# Patient Record
Sex: Female | Born: 1985 | Race: Black or African American | Hispanic: No | Marital: Single | Smoking: Never smoker
Health system: Southern US, Community
[De-identification: ages and names within clinical notes are randomized; demographics above are authoritative.]

## PROBLEM LIST (undated history)

## (undated) DIAGNOSIS — D649 Anemia, unspecified: Secondary | ICD-10-CM

## (undated) DIAGNOSIS — I10 Essential (primary) hypertension: Secondary | ICD-10-CM

---

## 2011-10-19 ENCOUNTER — Other Ambulatory Visit: Payer: Self-pay

## 2011-11-10 ENCOUNTER — Other Ambulatory Visit (HOSPITAL_COMMUNITY): Payer: Self-pay | Admitting: Obstetrics and Gynecology

## 2011-11-10 DIAGNOSIS — R772 Abnormality of alphafetoprotein: Secondary | ICD-10-CM

## 2011-11-13 ENCOUNTER — Ambulatory Visit (HOSPITAL_COMMUNITY): Payer: Medicaid Other

## 2011-11-17 ENCOUNTER — Ambulatory Visit (HOSPITAL_COMMUNITY): Payer: Medicaid Other

## 2011-11-17 ENCOUNTER — Ambulatory Visit (HOSPITAL_COMMUNITY): Admission: RE | Admit: 2011-11-17 | Payer: Medicaid Other | Source: Ambulatory Visit

## 2011-12-16 ENCOUNTER — Other Ambulatory Visit (HOSPITAL_COMMUNITY): Payer: Self-pay | Admitting: Obstetrics and Gynecology

## 2011-12-16 DIAGNOSIS — Z3689 Encounter for other specified antenatal screening: Secondary | ICD-10-CM

## 2011-12-16 DIAGNOSIS — R772 Abnormality of alphafetoprotein: Secondary | ICD-10-CM

## 2011-12-18 ENCOUNTER — Encounter (HOSPITAL_COMMUNITY): Payer: Self-pay | Admitting: Obstetrics and Gynecology

## 2011-12-29 ENCOUNTER — Ambulatory Visit (HOSPITAL_COMMUNITY)
Admission: RE | Admit: 2011-12-29 | Discharge: 2011-12-29 | Disposition: A | Discharge status: Home / Self Care | Payer: Medicaid Other | Source: Ambulatory Visit | Attending: Obstetrics and Gynecology | Admitting: Obstetrics and Gynecology

## 2011-12-29 ENCOUNTER — Ambulatory Visit (HOSPITAL_COMMUNITY): Payer: Medicaid Other

## 2011-12-29 DIAGNOSIS — R772 Abnormality of alphafetoprotein: Secondary | ICD-10-CM

## 2011-12-29 DIAGNOSIS — Z3689 Encounter for other specified antenatal screening: Secondary | ICD-10-CM

## 2013-06-26 ENCOUNTER — Emergency Department (HOSPITAL_BASED_OUTPATIENT_CLINIC_OR_DEPARTMENT_OTHER)
Admission: EM | Admit: 2013-06-26 | Discharge: 2013-06-26 | Disposition: A | Discharge status: Home / Self Care | Payer: Medicaid Other | Attending: Emergency Medicine | Admitting: Emergency Medicine

## 2013-06-26 ENCOUNTER — Encounter (HOSPITAL_BASED_OUTPATIENT_CLINIC_OR_DEPARTMENT_OTHER): Payer: Self-pay | Admitting: Emergency Medicine

## 2013-06-26 DIAGNOSIS — S90569A Insect bite (nonvenomous), unspecified ankle, initial encounter: Secondary | ICD-10-CM | POA: Insufficient documentation

## 2013-06-26 DIAGNOSIS — Y939 Activity, unspecified: Secondary | ICD-10-CM | POA: Insufficient documentation

## 2013-06-26 DIAGNOSIS — Y929 Unspecified place or not applicable: Secondary | ICD-10-CM | POA: Insufficient documentation

## 2013-06-26 DIAGNOSIS — W57XXXA Bitten or stung by nonvenomous insect and other nonvenomous arthropods, initial encounter: Secondary | ICD-10-CM

## 2013-06-26 MED ORDER — HYDROCORTISONE 1 % EX CREA: TOPICAL_CREAM | CUTANEOUS | Status: DC

## 2013-06-26 NOTE — ED Provider Notes (Signed)
CSN: [id number]     Arrival date & time 06/26/13  1941 History   First MD Initiated Contact with Patient 06/26/13 2110     Chief Complaint  Patient presents with  . Insect Bite     (Consider location/radiation/quality/duration/timing/severity/associated sxs/prior Treatment) HPI Comments: Presents emergency department with chief complaint of insect bite. She states that she noticed by this morning. She states it is moderately itchy, but not painful. She denies fevers, chills, or vomiting. She states that she is tried using antibiotic ointment on the bite. As also been taking amoxicillin for sore throat. She denies any aggravating or alleviating factors.  The history is provided by the patient. No language interpreter was used.    History reviewed. No pertinent past medical history. Past Surgical History  Procedure Laterality Date  . Cesarean section     No family history on file. History  Substance Use Topics  . Smoking status: Never Smoker   . Smokeless tobacco: Not on file  . Alcohol Use: No   OB History   Grav Para Term Preterm Abortions TAB SAB Ect Mult Living                 Review of Systems  Constitutional: Negative for fever and chills.  Respiratory: Negative for shortness of breath.   Cardiovascular: Negative for chest pain.  Gastrointestinal: Negative for nausea, vomiting, diarrhea and constipation.  Genitourinary: Negative for dysuria.  Skin: Positive for rash.       Small insect bite on the left upper thigh      Allergies  Review of patient's allergies indicates no known allergies.  Home Medications   Current Outpatient Rx  Name  Route  Sig  Dispense  Refill  . hydrocortisone cream 1 %      Apply to affected area 2 times daily   15 g   0    BP 126/96  Pulse 85  Temp(Src) 99.2 F (37.3 C) (Oral)  Resp 20  Ht 5\' 3"  (1.6 m)  Wt 144 lb (65.318 kg)  BMI 25.51 kg/m2  SpO2 100%  LMP 05/29/2013 Physical Exam  Nursing note and vitals  reviewed. Constitutional: She is oriented to person, place, and time. She appears well-developed and well-nourished.  HENT:  Head: Normocephalic and atraumatic.  Eyes: Conjunctivae and EOM are normal.  Neck: Normal range of motion.  Cardiovascular: Normal rate.   Pulmonary/Chest: Effort normal.  Abdominal: She exhibits no distension.  Musculoskeletal: Normal range of motion.  Neurological: She is alert and oriented to person, place, and time.  Skin: Skin is dry.  2 x 2 centimeter erythematous area to the left upper thigh, no abscess, non-indurated, no fluctuance, appears to be allergic, and not cellulitic  Psychiatric: She has a normal mood and affect. Her behavior is normal. Judgment and thought content normal.    ED Course  Procedures (including critical care time) Labs Review Labs Reviewed - No data to display Imaging Review No results found.   EKG Interpretation None      MDM   Final diagnoses:  Insect bite    Patient with insect bite. Will treat with hydrocortisone cream. Recommend discharge. Followup with PCP. Return precautions regarding cellulitis are given. Patient is stable and ready for discharge.   Roxy Horsemanobert Mayre Bury, PA-C 06/26/13 2152

## 2013-06-26 NOTE — Discharge Instructions (Signed)
Insect Bite  Mosquitoes, flies, fleas, bedbugs, and many other insects can bite. Insect bites are different from insect stings. A sting is when venom is injected into the skin. Some insect bites can transmit infectious diseases.  SYMPTOMS   Insect bites usually turn red, swell, and itch for 2 to 4 days. They often go away on their own.  TREATMENT   Your caregiver may prescribe antibiotic medicines if a bacterial infection develops in the bite.  HOME CARE INSTRUCTIONS   Do not scratch the bite area.   Keep the bite area clean and dry. Wash the bite area thoroughly with soap and water.   Put ice or cool compresses on the bite area.   Put ice in a plastic bag.   Place a towel between your skin and the bag.   Leave the ice on for 20 minutes, 4 times a day for the first 2 to 3 days, or as directed.   You may apply a baking soda paste, cortisone cream, or calamine lotion to the bite area as directed by your caregiver. This can help reduce itching and swelling.   Only take over-the-counter or prescription medicines as directed by your caregiver.   If you are given antibiotics, take them as directed. Finish them even if you start to feel better.  You may need a tetanus shot if:   You cannot remember when you had your last tetanus shot.   You have never had a tetanus shot.   The injury broke your skin.  If you get a tetanus shot, your arm may swell, get red, and feel warm to the touch. This is common and not a problem. If you need a tetanus shot and you choose not to have one, there is a rare chance of getting tetanus. Sickness from tetanus can be serious.  SEEK IMMEDIATE MEDICAL CARE IF:    You have increased pain, redness, or swelling in the bite area.   You see a red line on the skin coming from the bite.   You have a fever.   You have joint pain.   You have a headache or neck pain.   You have unusual weakness.   You have a rash.   You have chest pain or shortness of breath.    You have abdominal pain, nausea, or vomiting.   You feel unusually tired or sleepy.  MAKE SURE YOU:    Understand these instructions.   Will watch your condition.   Will get help right away if you are not doing well or get worse.  Document Released: 04/09/2004 Document Revised: 05/25/2011 Document Reviewed: 10/01/2010  ExitCare Patient Information 2014 [employer].

## 2013-06-26 NOTE — ED Provider Notes (Signed)
Medical screening examination/treatment/procedure(s) were performed by non-physician practitioner and as supervising physician I was immediately available for consultation/collaboration.   EKG Interpretation None        Kaoir Loree, MD 06/26/13 2254 

## 2013-06-26 NOTE — ED Notes (Signed)
Possible insect bite to her left upper leg. Itching and red since this am.

## 2014-07-28 ENCOUNTER — Emergency Department (HOSPITAL_BASED_OUTPATIENT_CLINIC_OR_DEPARTMENT_OTHER)
Admission: EM | Admit: 2014-07-28 | Discharge: 2014-07-28 | Disposition: A | Discharge status: Home / Self Care | Payer: Medicaid Other | Attending: Emergency Medicine | Admitting: Emergency Medicine

## 2014-07-28 ENCOUNTER — Encounter (HOSPITAL_BASED_OUTPATIENT_CLINIC_OR_DEPARTMENT_OTHER): Payer: Self-pay

## 2014-07-28 DIAGNOSIS — R030 Elevated blood-pressure reading, without diagnosis of hypertension: Secondary | ICD-10-CM | POA: Diagnosis not present

## 2014-07-28 DIAGNOSIS — K0381 Cracked tooth: Secondary | ICD-10-CM | POA: Diagnosis not present

## 2014-07-28 DIAGNOSIS — K088 Other specified disorders of teeth and supporting structures: Secondary | ICD-10-CM | POA: Insufficient documentation

## 2014-07-28 DIAGNOSIS — K0889 Other specified disorders of teeth and supporting structures: Secondary | ICD-10-CM

## 2014-07-28 MED ORDER — IBUPROFEN 800 MG PO TABS: 800.0000 mg | ORAL_TABLET | Freq: Three times a day (TID) | ORAL | Status: DC

## 2014-07-28 MED ORDER — BUPIVACAINE-EPINEPHRINE (PF) 0.5% -1:200000 IJ SOLN
1.8000 mL | Freq: Once | INTRAMUSCULAR | Status: AC
  Administered 2014-07-28: 1.8 mL
  Filled 2014-07-28: qty 1.8

## 2014-07-28 MED ORDER — PENICILLIN V POTASSIUM 500 MG PO TABS: 500.0000 mg | ORAL_TABLET | Freq: Four times a day (QID) | ORAL | Status: AC

## 2014-07-28 NOTE — Discharge Instructions (Signed)
It is important for you to go to your dentist for regularly scheduled appointment and definitive dental care. Please take your antibiotics as directed until gone. Take your Motrin as needed for discomfort. Return to ED for new or worsening symptoms.  Dental Pain A tooth ache may be caused by cavities (tooth decay). Cavities expose the nerve of the tooth to air and hot or cold temperatures. It may come from an infection or abscess (also called a boil or furuncle) around your tooth. It is also often caused by dental caries (tooth decay). This causes the pain you are having. DIAGNOSIS  Your caregiver can diagnose this problem by exam. TREATMENT   If caused by an infection, it may be treated with medications which kill germs (antibiotics) and pain medications as prescribed by your caregiver. Take medications as directed.  Only take over-the-counter or prescription medicines for pain, discomfort, or fever as directed by your caregiver.  Whether the tooth ache today is caused by infection or dental disease, you should see your dentist as soon as possible for further care. SEEK MEDICAL CARE IF: The exam and treatment you received today has been provided on an emergency basis only. This is not a substitute for complete medical or dental care. If your problem worsens or new problems (symptoms) appear, and you are unable to meet with your dentist, call or return to this location. SEEK IMMEDIATE MEDICAL CARE IF:   You have a fever.  You develop redness and swelling of your face, jaw, or neck.  You are unable to open your mouth.  You have severe pain uncontrolled by pain medicine. MAKE SURE YOU:   Understand these instructions.  Will watch your condition.  Will get help right away if you are not doing well or get worse. Document Released: 03/02/2005 Document Revised: 05/25/2011 Document Reviewed: 10/19/2007 [facility]Exit[facility] Patient Information 2015 [place]ExitCare, [place]. This information is not intended to  replace advice given to you by your health care provider. Make sure you discuss any questions you have with your health care provider.  Dental Care and Dentist Visits Dental care supports good overall health. Regular dental visits can also help you avoid dental pain, bleeding, infection, and other more serious health problems in the future. It is important to keep the mouth healthy because diseases in the teeth, gums, and other oral tissues can spread to other areas of the body. Some problems, such as diabetes, heart disease, and pre-term labor have been associated with poor oral health.  See your dentist every 6 months. If you experience emergency problems such as a toothache or broken tooth, go to the dentist right away. If you see your dentist regularly, you may catch problems early. It is easier to be treated for problems in the early stages.  WHAT TO EXPECT AT A DENTIST VISIT  Your dentist will look for many common oral health problems and recommend proper treatment. At your regular dental visit, you can expect:  Gentle cleaning of the teeth and gums. This includes scraping and polishing. This helps to remove the sticky substance around the teeth and gums (plaque). Plaque forms in the mouth shortly after eating. Over time, plaque hardens on the teeth as tartar. If tartar is not removed regularly, it can cause problems. Cleaning also helps remove stains.  Periodic X-rays. These pictures of the teeth and supporting bone will help your dentist assess the health of your teeth.  Periodic fluoride treatments. Fluoride is a natural mineral shown to help strengthen teeth. Fluoride  treatmentinvolves applying a fluoride gel or varnish to the teeth. It is most commonly done in children.  Examination of the mouth, tongue, jaws, teeth, and gums to look for any oral health problems, such as:  Cavities (dental caries). This is decay on the tooth caused by plaque, sugar, and acid in the mouth. It is best to  catch a cavity when it is small.  Inflammation of the gums caused by plaque buildup (gingivitis).  Problems with the mouth or malformed or misaligned teeth.  Oral cancer or other diseases of the soft tissues or jaws. KEEP YOUR TEETH AND GUMS HEALTHY For healthy teeth and gums, follow these general guidelines as well as your dentist's specific advice:  Have your teeth professionally cleaned at the dentist every 6 months.  Brush twice daily with a fluoride toothpaste.  Floss your teeth daily.  Ask your dentist if you need fluoride supplements, treatments, or fluoride toothpaste.  Eat a healthy diet. Reduce foods and drinks with added sugar.  Avoid smoking. TREATMENT FOR ORAL HEALTH PROBLEMS If you have oral health problems, treatment varies depending on the conditions present in your teeth and gums.  Your caregiver will most likely recommend good oral hygiene at each visit.  For cavities, gingivitis, or other oral health disease, your caregiver will perform a procedure to treat the problem. This is typically done at a separate appointment. Sometimes your caregiver will refer you to another dental specialist for specific tooth problems or for surgery. SEEK IMMEDIATE DENTAL CARE IF:  You have pain, bleeding, or soreness in the gum, tooth, jaw, or mouth area.  A permanent tooth becomes loose or separated from the gum socket.  You experience a blow or injury to the mouth or jaw area. Document Released: 11/12/2010 Document Revised: 05/25/2011 Document Reviewed: 11/12/2010 [facility]ExitCare Patient Information 2015 ReaganExitCare, MarylandLLC. This information is not intended to replace advice given to you by your health care provider. Make sure you discuss any questions you have with your health care provider.

## 2014-07-28 NOTE — ED Notes (Signed)
Patient here with right lower toothache for the past few days, broken for a few months

## 2014-07-28 NOTE — ED Provider Notes (Signed)
CSN: [id number]     Arrival date & time 07/28/14  1721 History   First MD Initiated Contact with Patient 07/28/14 1739     Chief Complaint  Patient presents with  . Dental Pain     (Consider location/radiation/quality/duration/timing/severity/associated sxs/prior Treatment) HPI Anna Brock is a 29 y.o. female who comes in for evaluation of right lower toothache. Patient states originally her tooth pain began a few months ago when she bit into some food that had some retained plastic utensil and it cracked her tooth. She reports having a dentist appointment on Monday for follow-up. Came to ED for pain relief. Denies fevers, chills, difficulty swallowing, breathing, changes in phonation. No other modifying factors.  History reviewed. No pertinent past medical history. Past Surgical History  Procedure Laterality Date  . Cesarean section     No family history on file. History  Substance Use Topics  . Smoking status: Never Smoker   . Smokeless tobacco: Not on file  . Alcohol Use: No   OB History    No data available     Review of Systems A 10 point review of systems was completed and was negative except for pertinent positives and negatives as mentioned in the history of present illness     Allergies  Review of patient's allergies indicates no known allergies.  Home Medications   Prior to Admission medications   Medication Sig Start Date End Date Taking? Authorizing Provider  ibuprofen (ADVIL,MOTRIN) 800 MG tablet Take 1 tablet (800 mg total) by mouth 3 (three) times daily. 07/28/14   Joycie PeekBenjamin Symeon Puleo, PA-C  penicillin v potassium (VEETID) 500 MG tablet Take 1 tablet (500 mg total) by mouth 4 (four) times daily. 07/28/14 08/04/14  Joycie PeekBenjamin Julliana Whitmyer, PA-C   BP 155/119 mmHg  Pulse 85  Temp(Src) 99.3 F (37.4 C)  Resp 18  Ht 5\' 3"  (1.6 m)  Wt 160 lb (72.576 kg)  BMI 28.35 kg/m2  SpO2 100% Physical Exam  Constitutional:  Awake, alert, nontoxic appearance.  HENT:  Head:  Atraumatic.  Discomfort located to right posterior lower molar. Chronic, complete axial tooth fracture. Otherwise appropriate dentition. Mucous membranes are moist. No unilateral tonsillar swelling, uvula midline, no glossal swelling or elevation. No trismus. No fluctuance or evidence of a drainable abscess. No other evidence of emergent infection, Retropharyngeal or Peritonsillar abscess, Ludwig or Vincents angina. Tolerating secretions well. Patent airway   Eyes: Right eye exhibits no discharge. Left eye exhibits no discharge.  Neck: Neck supple.  Pulmonary/Chest: Effort normal. She exhibits no tenderness.  Abdominal: Soft. There is no tenderness. There is no rebound.  Musculoskeletal: She exhibits no tenderness.  Baseline ROM, no obvious new focal weakness.  Neurological:  Mental status and motor strength appears baseline for patient and situation.  Skin: No rash noted.  Psychiatric: She has a normal mood and affect.  Nursing note and vitals reviewed.   ED Course  Procedures (including critical care time) NERVE BLOCK Performed by: Sharlene Mottsartner, Markail Diekman W Consent: Verbal consent obtained. Required items: required blood products, implants, devices, and special equipment available Time out: Immediately prior to procedure a "time out" was called to verify the correct patient, procedure, equipment, support staff and site/side marked as required.  Indication: Dental pain  Nerve block body site: Right inferior alveolar   Preparation: Patient was prepped and draped in the usual sterile fashion. Needle gauge: 24 G Location technique: anatomical landmarks  Local anesthetic: Bupivacaine   Anesthetic total: 1.8 ml  Outcome: pain improved Patient tolerance: Patient  tolerated the procedure well with no immediate complications.  Labs Review Labs Reviewed - No data to display  Imaging Review No results found.   EKG Interpretation None     Meds given in ED:  Medications   bupivacaine-epinephrine (MARCAINE W/ EPI) 0.5% -1:200000 injection 1.8 mL (1.8 mLs Infiltration Given by Other 07/28/14 1820)    Discharge Medication List as of 07/28/2014  6:41 PM    START taking these medications   Details  penicillin v potassium (VEETID) 500 MG tablet Take 1 tablet (500 mg total) by mouth 4 (four) times daily., Starting 07/28/2014, Until Sat 08/04/14, Print        MDM  Vitals stable -afebrile.. Elevated blood pressure likely secondary to pain, improving in ED. Pt resting comfortably in ED.  reports resolution of dental pain following oral nerve block. PE--physical exam not concerning for acute or emergent pathology. Pain airway tolerate secretions well without trismus also elevation or changes in phonation. Stress importance for patient to go to her dentist for her regularly scheduled appointment on Monday. Will DC with antibiotics, NSAIDs for discomfort. I discussed all relevant lab findings and imaging results with pt and they verbalized understanding. Discussed f/u with PCP within 48 hrs and return precautions, pt very amenable to plan.  Final diagnoses:  Pain, dental      Joycie PeekBenjamin Catlyn Shipton, PA-C 07/29/14 [address]1159  Florina Glas, PA-C 07/29/14 1221  Richardean Canalavid H Yao, MD 07/29/14 [phone]

## 2014-10-04 ENCOUNTER — Emergency Department (HOSPITAL_BASED_OUTPATIENT_CLINIC_OR_DEPARTMENT_OTHER): Payer: Medicaid Other

## 2014-10-04 ENCOUNTER — Emergency Department (HOSPITAL_BASED_OUTPATIENT_CLINIC_OR_DEPARTMENT_OTHER)
Admission: EM | Admit: 2014-10-04 | Discharge: 2014-10-04 | Disposition: A | Discharge status: Home / Self Care | Payer: Medicaid Other | Attending: Emergency Medicine | Admitting: Emergency Medicine

## 2014-10-04 ENCOUNTER — Encounter (HOSPITAL_BASED_OUTPATIENT_CLINIC_OR_DEPARTMENT_OTHER): Payer: Self-pay | Admitting: Emergency Medicine

## 2014-10-04 DIAGNOSIS — R0789 Other chest pain: Secondary | ICD-10-CM | POA: Diagnosis not present

## 2014-10-04 DIAGNOSIS — R42 Dizziness and giddiness: Secondary | ICD-10-CM | POA: Insufficient documentation

## 2014-10-04 DIAGNOSIS — R0981 Nasal congestion: Secondary | ICD-10-CM | POA: Diagnosis not present

## 2014-10-04 DIAGNOSIS — R0602 Shortness of breath: Secondary | ICD-10-CM | POA: Diagnosis not present

## 2014-10-04 DIAGNOSIS — R202 Paresthesia of skin: Secondary | ICD-10-CM | POA: Insufficient documentation

## 2014-10-04 DIAGNOSIS — Z7951 Long term (current) use of inhaled steroids: Secondary | ICD-10-CM | POA: Insufficient documentation

## 2014-10-04 DIAGNOSIS — R200 Anesthesia of skin: Secondary | ICD-10-CM | POA: Insufficient documentation

## 2014-10-04 DIAGNOSIS — E876 Hypokalemia: Secondary | ICD-10-CM | POA: Diagnosis not present

## 2014-10-04 DIAGNOSIS — Z862 Personal history of diseases of the blood and blood-forming organs and certain disorders involving the immune mechanism: Secondary | ICD-10-CM | POA: Insufficient documentation

## 2014-10-04 DIAGNOSIS — Z791 Long term (current) use of non-steroidal anti-inflammatories (NSAID): Secondary | ICD-10-CM | POA: Diagnosis not present

## 2014-10-04 DIAGNOSIS — R079 Chest pain, unspecified: Secondary | ICD-10-CM | POA: Diagnosis present

## 2014-10-04 HISTORY — DX: Anemia, unspecified: D64.9

## 2014-10-04 LAB — CBC WITH DIFFERENTIAL/PLATELET
Basophils Absolute: 0 10*3/uL (ref 0.0–0.1)
Basophils Relative: 1 % (ref 0–1)
Eosinophils Absolute: 0.2 10*3/uL (ref 0.0–0.7)
Eosinophils Relative: 5 % (ref 0–5)
HCT: 33.8 % — ABNORMAL LOW (ref 36.0–46.0)
Hemoglobin: 10.8 g/dL — ABNORMAL LOW (ref 12.0–15.0)
LYMPHS PCT: 56 % — AB (ref 12–46)
Lymphs Abs: 2.3 10*3/uL (ref 0.7–4.0)
MCH: 26.9 pg (ref 26.0–34.0)
MCHC: 32 g/dL (ref 30.0–36.0)
MCV: 84.1 fL (ref 78.0–100.0)
MONOS PCT: 6 % (ref 3–12)
Monocytes Absolute: 0.2 10*3/uL (ref 0.1–1.0)
NEUTROS PCT: 32 % — AB (ref 43–77)
Neutro Abs: 1.3 10*3/uL — ABNORMAL LOW (ref 1.7–7.7)
PLATELETS: 299 10*3/uL (ref 150–400)
RBC: 4.02 MIL/uL (ref 3.87–5.11)
RDW: 14.5 % (ref 11.5–15.5)
WBC: 4.1 10*3/uL (ref 4.0–10.5)

## 2014-10-04 LAB — COMPREHENSIVE METABOLIC PANEL
ALT: 14 U/L (ref 14–54)
AST: 19 U/L (ref 15–41)
Albumin: 3.8 g/dL (ref 3.5–5.0)
Alkaline Phosphatase: 57 U/L (ref 38–126)
Anion gap: 4 — ABNORMAL LOW (ref 5–15)
BUN: 6 mg/dL (ref 6–20)
CALCIUM: 8.9 mg/dL (ref 8.9–10.3)
CHLORIDE: 109 mmol/L (ref 101–111)
CO2: 25 mmol/L (ref 22–32)
CREATININE: 0.83 mg/dL (ref 0.44–1.00)
GFR calc Af Amer: >60 mL/min (ref >60)
GFR calc non Af Amer: >60 mL/min (ref >60)
Glucose, Bld: 96 mg/dL (ref 65–99)
Potassium: 3.1 mmol/L — ABNORMAL LOW (ref 3.5–5.1)
Sodium: 138 mmol/L (ref 135–145)
TOTAL PROTEIN: 7 g/dL (ref 6.5–8.1)
Total Bilirubin: 0.3 mg/dL (ref 0.3–1.2)

## 2014-10-04 LAB — D-DIMER, QUANTITATIVE: D-Dimer, Quant: <0.27 ug/mL-FEU (ref 0.00–0.48)

## 2014-10-04 LAB — TROPONIN I: Troponin I: <0.03 ng/mL (ref <0.031)

## 2014-10-04 MED ORDER — POTASSIUM CHLORIDE CRYS ER 20 MEQ PO TBCR
40.0000 meq | EXTENDED_RELEASE_TABLET | Freq: Once | ORAL | Status: AC
  Administered 2014-10-04: 40 meq via ORAL
  Filled 2014-10-04: qty 2

## 2014-10-04 NOTE — Discharge Instructions (Signed)
Chest Pain (Nonspecific) °There is no evidence of heart attack or blood clot in the lung. Follow up with your doctor. Return to the ED if you develop new or worsening symptoms. °It is often hard to give a specific diagnosis for the cause of chest pain. There is always a chance that your pain could be related to something serious, such as a heart attack or a blood clot in the lungs. You need to follow up with your health care provider for further evaluation. °CAUSES  °· Heartburn. °· Pneumonia or bronchitis. °· Anxiety or stress. °· Inflammation around your heart (pericarditis) or lung (pleuritis or pleurisy). °· A blood clot in the lung. °· A collapsed lung (pneumothorax). It can develop suddenly on its own (spontaneous pneumothorax) or from trauma to the chest. °· Shingles infection (herpes zoster virus). °The chest wall is composed of bones, muscles, and cartilage. Any of these can be the source of the pain. °· The bones can be bruised by injury. °· The muscles or cartilage can be strained by coughing or overwork. °· The cartilage can be affected by inflammation and become sore (costochondritis). °DIAGNOSIS  °Lab tests or other studies may be needed to find the cause of your pain. Your health care provider may have you take a test called an ambulatory electrocardiogram (ECG). An ECG records your heartbeat patterns over a 24-hour period. You may also have other tests, such as: °· Transthoracic echocardiogram (TTE). During echocardiography, sound waves are used to evaluate how blood flows through your heart. °· Transesophageal echocardiogram (TEE). °· Cardiac monitoring. This allows your health care provider to monitor your heart rate and rhythm in real time. °· Holter monitor. This is a portable device that records your heartbeat and can help diagnose heart arrhythmias. It allows your health care provider to track your heart activity for several days, if needed. °· Stress tests by exercise or by giving medicine  that makes the heart beat faster. °TREATMENT  °· Treatment depends on what may be causing your chest pain. Treatment may include: °¨ Acid blockers for heartburn. °¨ Anti-inflammatory medicine. °¨ Pain medicine for inflammatory conditions. °¨ Antibiotics if an infection is present. °· You may be advised to change lifestyle habits. This includes stopping smoking and avoiding alcohol, caffeine, and chocolate. °· You may be advised to keep your head raised (elevated) when sleeping. This reduces the chance of acid going backward from your stomach into your esophagus. °Most of the time, nonspecific chest pain will improve within 2-3 days with rest and mild pain medicine.  °HOME CARE INSTRUCTIONS  °· If antibiotics were prescribed, take them as directed. Finish them even if you start to feel better. °· For the next few days, avoid physical activities that bring on chest pain. Continue physical activities as directed. °· Do not use any tobacco products, including cigarettes, chewing tobacco, or electronic cigarettes. °· Avoid drinking alcohol. °· Only take medicine as directed by your health care provider. °· Follow your health care provider's suggestions for further testing if your chest pain does not go away. °· Keep any follow-up appointments you made. If you do not go to an appointment, you could develop lasting (chronic) problems with pain. If there is any problem keeping an appointment, call to reschedule. °SEEK MEDICAL CARE IF:  °· Your chest pain does not go away, even after treatment. °· You have a rash with blisters on your chest. °· You have a fever. °SEEK IMMEDIATE MEDICAL CARE IF:  °· You have increased   chest pain or pain that spreads to your arm, neck, jaw, back, or abdomen. °· You have shortness of breath. °· You have an increasing cough, or you cough up blood. °· You have severe back or abdominal pain. °· You feel nauseous or vomit. °· You have severe weakness. °· You faint. °· You have chills. °This is an  emergency. Do not wait to see if the pain will go away. Get medical help at once. Call your local emergency services (911 in [place] Do not drive yourself to the hospital. °MAKE SURE YOU:  °· Understand these instructions. °· Will watch your condition. °· Will get help right away if you are not doing well or get worse. °Document Released: 12/10/2004 Document Revised: 03/07/2013 Document Reviewed: 10/06/2007 °ExitCare® Patient Information ©2015 [employer]. This information is not intended to replace advice given to you by your health care provider. Make sure you discuss any questions you have with your health care provider. ° °

## 2014-10-04 NOTE — ED Notes (Signed)
Nurse first-pt assisted from car to w/c to ED lobby-pt stood w/o difficulty-c/o difficulty breathing-pt in no resp distress

## 2014-10-04 NOTE — ED Notes (Signed)
Upper left sided CP that started 2 hours ago.  Radiation to left arm.  SOB and lightheaded.

## 2014-10-04 NOTE — ED Provider Notes (Signed)
CSN: [id number]     Arrival date & time 10/04/14  1911 History  This chart was scribed for Glynn Octave, MD by Budd Palmer, ED Scribe. This patient was seen in room MH03/MH03 and the patient's care was started at 7:33 PM.    Chief Complaint  Patient presents with  . Chest Pain   The history is provided by the patient. No language interpreter was used.   HPI Comments: Anna Brock is a 29 y.o. female with a PMHx of anemia who presents to the Emergency Department complaining of intermittent, upper left-sided, pressure-like CP onset 2 hours ago. Pt states the pain lasts for about 5 minutes each time, and occurs every few minutes. She notes associated bilateral leg and left arm numbness and tingling. She also reports congestion in the nose and ears, lightheadedness, as well as SOB. She has started her most recent period 2 days ago and reports heavy bleeding and discharge of clots for the past 2 nights. She is currently on birth control. Pt denies sore throat, back pain, vomiting, HA, and visual disturbances.  Past Medical History  Diagnosis Date  . Anemia    Past Surgical History  Procedure Laterality Date  . Cesarean section     No family history on file. History  Substance Use Topics  . Smoking status: Never Smoker   . Smokeless tobacco: Not on file  . Alcohol Use: No   OB History    No data available     Review of Systems A complete 10 system review of systems was obtained and all systems are negative except as noted in the HPI and PMH.   Allergies  Review of patient's allergies indicates no known allergies.  Home Medications   Prior to Admission medications   Medication Sig Start Date End Date Taking? Authorizing Provider  fluticasone (FLONASE) 50 MCG/ACT nasal spray Place 1 spray into both nostrils daily.   Yes Historical Provider, MD  ibuprofen (ADVIL,MOTRIN) 800 MG tablet Take 1 tablet (800 mg total) by mouth 3 (three) times daily. 07/28/14   Benjamin Cartner,  PA-C   BP 131/97 mmHg  Pulse 75  Temp(Src) 98.4 F (36.9 C) (Oral)  Resp 18  Ht 5\' 3"  (1.6 m)  Wt 165 lb (74.844 kg)  BMI 29.24 kg/m2  SpO2 100%  LMP 09/20/2014 (Approximate) Physical Exam  Constitutional: She is oriented to person, place, and time. She appears well-developed and well-nourished. No distress.  HENT:  Head: Normocephalic and atraumatic.  Mouth/Throat: Oropharynx is clear and moist. No oropharyngeal exudate.  Eyes: Conjunctivae and EOM are normal. Pupils are equal, round, and reactive to light.  Neck: Normal range of motion. Neck supple.  No meningismus.  Cardiovascular: Normal rate, regular rhythm, normal heart sounds and intact distal pulses.   No murmur heard. Pulmonary/Chest: Effort normal and breath sounds normal. No respiratory distress.  Left chest wall is TTP  Abdominal: Soft. There is no tenderness. There is no rebound and no guarding.  Musculoskeletal: Normal range of motion. She exhibits no edema or tenderness.  Neurological: She is alert and oriented to person, place, and time. No cranial nerve deficit. She exhibits normal muscle tone. Coordination normal.  No ataxia on finger to nose bilaterally. No pronator drift. 5/5 strength throughout. CN 2-12 intact. Negative Romberg. Equal grip strength. Sensation intact. Gait is normal. Equal Strength and sensation to arms and legs.  Skin: Skin is warm.  Psychiatric: She has a normal mood and affect. Her behavior is normal.  Nursing note  and vitals reviewed.   ED Course  Procedures  DIAGNOSTIC STUDIES: Oxygen Saturation is 100% on RA, normal by my interpretation.    COORDINATION OF CARE: 7:38 PM - Discussed plans to order chest XR and other diagnostic studies. Pt advised of plan for treatment and pt agrees.  Labs Review Labs Reviewed  CBC WITH DIFFERENTIAL/PLATELET - Abnormal; Notable for the following:    Hemoglobin 10.8 (*)    HCT 33.8 (*)    Neutrophils Relative % 32 (*)    Neutro Abs 1.3 (*)     Lymphocytes Relative 56 (*)    All other components within normal limits  COMPREHENSIVE METABOLIC PANEL - Abnormal; Notable for the following:    Potassium 3.1 (*)    Anion gap 4 (*)    All other components within normal limits  TROPONIN I  D-DIMER, QUANTITATIVE (NOT AT [facility])    Imaging Review Dg Chest 2 View  10/04/2014   CLINICAL DATA:  Intermittent upper left chest pressure-like chest pain for the past 2 hours. Shortness of breath.  EXAM: CHEST  2 VIEW  COMPARISON:  None.  FINDINGS: The heart size and mediastinal contours are within normal limits. Both lungs are clear. The visualized skeletal structures are unremarkable.  IMPRESSION: Normal examination.   Electronically Signed   By: Beckie Salts M.D.   On: 10/04/2014 20:11     EKG Interpretation   Date/Time:  Thursday October 04 2014 19:37:49 EDT Ventricular Rate:  77 PR Interval:  144 QRS Duration: 90 QT Interval:  376 QTC Calculation: 425 R Axis:   88 Text Interpretation:  Normal sinus rhythm Normal ECG No previous ECGs  available Confirmed by Manus Gunning  MD, Joyell Emami [phone]) on 10/04/2014 8:00:41  PM      MDM   Final diagnoses:  Atypical chest pain  Hypokalemia   Left-sided chest pain that onset 2 hours ago, coming and going associated with shortness of breath and lightheadedness. No fever, cough, difficulty swallowing. Patient is on birth control. EKG normal sinus rhythm.  Left upper chest wall is tender to palpation.  Hemoglobin 10.8. No comparison. Chest x-rays negative. D-dimer is negative.  Low suspicion for ACS or PE. Pain has resolved in the ED. No shortness of breath, nausea or lightheadedness.  Treat conservatively for chest wall pain. Follow-up with PCP. Return precautions discussed.   I personally performed the services described in this documentation, which was scribed in my presence. The recorded information has been reviewed and is accurate.   Glynn Octave, MD 10/04/14 [phone]

## 2015-02-26 ENCOUNTER — Emergency Department (HOSPITAL_BASED_OUTPATIENT_CLINIC_OR_DEPARTMENT_OTHER): Payer: Medicaid Other

## 2015-02-26 ENCOUNTER — Encounter (HOSPITAL_BASED_OUTPATIENT_CLINIC_OR_DEPARTMENT_OTHER): Payer: Self-pay

## 2015-02-26 ENCOUNTER — Emergency Department (HOSPITAL_BASED_OUTPATIENT_CLINIC_OR_DEPARTMENT_OTHER)
Admission: EM | Admit: 2015-02-26 | Discharge: 2015-02-26 | Disposition: A | Discharge status: Home / Self Care | Payer: Medicaid Other | Attending: Emergency Medicine | Admitting: Emergency Medicine

## 2015-02-26 DIAGNOSIS — R51 Headache: Secondary | ICD-10-CM | POA: Diagnosis present

## 2015-02-26 DIAGNOSIS — S4992XA Unspecified injury of left shoulder and upper arm, initial encounter: Secondary | ICD-10-CM | POA: Insufficient documentation

## 2015-02-26 DIAGNOSIS — R110 Nausea: Secondary | ICD-10-CM | POA: Diagnosis not present

## 2015-02-26 DIAGNOSIS — R519 Headache, unspecified: Secondary | ICD-10-CM

## 2015-02-26 DIAGNOSIS — I10 Essential (primary) hypertension: Secondary | ICD-10-CM | POA: Insufficient documentation

## 2015-02-26 DIAGNOSIS — Z792 Long term (current) use of antibiotics: Secondary | ICD-10-CM | POA: Insufficient documentation

## 2015-02-26 DIAGNOSIS — T148XXA Other injury of unspecified body region, initial encounter: Secondary | ICD-10-CM

## 2015-02-26 DIAGNOSIS — R0789 Other chest pain: Secondary | ICD-10-CM | POA: Insufficient documentation

## 2015-02-26 DIAGNOSIS — Y9289 Other specified places as the place of occurrence of the external cause: Secondary | ICD-10-CM | POA: Insufficient documentation

## 2015-02-26 DIAGNOSIS — R079 Chest pain, unspecified: Secondary | ICD-10-CM

## 2015-02-26 DIAGNOSIS — X58XXXA Exposure to other specified factors, initial encounter: Secondary | ICD-10-CM | POA: Insufficient documentation

## 2015-02-26 DIAGNOSIS — Z862 Personal history of diseases of the blood and blood-forming organs and certain disorders involving the immune mechanism: Secondary | ICD-10-CM | POA: Diagnosis not present

## 2015-02-26 DIAGNOSIS — T148 Other injury of unspecified body region: Secondary | ICD-10-CM | POA: Diagnosis not present

## 2015-02-26 DIAGNOSIS — Y998 Other external cause status: Secondary | ICD-10-CM | POA: Insufficient documentation

## 2015-02-26 DIAGNOSIS — Y9389 Activity, other specified: Secondary | ICD-10-CM | POA: Insufficient documentation

## 2015-02-26 LAB — CBC WITH DIFFERENTIAL/PLATELET
BASOS ABS: 0 10*3/uL (ref 0.0–0.1)
Basophils Relative: 1 %
EOS PCT: 4 %
Eosinophils Absolute: 0.2 10*3/uL (ref 0.0–0.7)
HCT: 30.3 % — ABNORMAL LOW (ref 36.0–46.0)
Hemoglobin: 9.4 g/dL — ABNORMAL LOW (ref 12.0–15.0)
LYMPHS PCT: 53 %
Lymphs Abs: 2.3 10*3/uL (ref 0.7–4.0)
MCH: 24.7 pg — ABNORMAL LOW (ref 26.0–34.0)
MCHC: 31 g/dL (ref 30.0–36.0)
MCV: 79.7 fL (ref 78.0–100.0)
MONO ABS: 0.3 10*3/uL (ref 0.1–1.0)
Monocytes Relative: 7 %
Neutro Abs: 1.5 10*3/uL — ABNORMAL LOW (ref 1.7–7.7)
Neutrophils Relative %: 35 %
PLATELETS: 330 10*3/uL (ref 150–400)
RBC: 3.8 MIL/uL — ABNORMAL LOW (ref 3.87–5.11)
RDW: 18.4 % — AB (ref 11.5–15.5)
WBC: 4.3 10*3/uL (ref 4.0–10.5)

## 2015-02-26 LAB — COMPREHENSIVE METABOLIC PANEL
ALBUMIN: 4.1 g/dL (ref 3.5–5.0)
ALT: 8 U/L — ABNORMAL LOW (ref 14–54)
AST: 16 U/L (ref 15–41)
Alkaline Phosphatase: 53 U/L (ref 38–126)
Anion gap: 7 (ref 5–15)
CO2: 26 mmol/L (ref 22–32)
Calcium: 9.4 mg/dL (ref 8.9–10.3)
Chloride: 106 mmol/L (ref 101–111)
Creatinine, Ser: 0.68 mg/dL (ref 0.44–1.00)
GFR calc Af Amer: >60 mL/min (ref >60)
GFR calc non Af Amer: >60 mL/min (ref >60)
GLUCOSE: 89 mg/dL (ref 65–99)
POTASSIUM: 3.8 mmol/L (ref 3.5–5.1)
SODIUM: 139 mmol/L (ref 135–145)
Total Bilirubin: 0.6 mg/dL (ref 0.3–1.2)
Total Protein: 7.5 g/dL (ref 6.5–8.1)

## 2015-02-26 LAB — TROPONIN I: Troponin I: <0.03 ng/mL (ref <0.031)

## 2015-02-26 MED ORDER — AMLODIPINE BESYLATE 2.5 MG PO TABS: 2.5000 mg | ORAL_TABLET | Freq: Every day | ORAL | Status: DC

## 2015-02-26 MED ORDER — KETOROLAC TROMETHAMINE 60 MG/2ML IM SOLN
60.0000 mg | Freq: Once | INTRAMUSCULAR | Status: AC
  Administered 2015-02-26: 60 mg via INTRAMUSCULAR
  Filled 2015-02-26: qty 2

## 2015-02-26 NOTE — ED Notes (Signed)
MD at bedside. 

## 2015-02-26 NOTE — ED Provider Notes (Signed)
CSN: [id number]     Arrival date & time 02/26/15  1602 History   First MD Initiated Contact with Patient 02/26/15 1625     Chief Complaint  Patient presents with  . Headache  . Chest Pain     (Consider location/radiation/quality/duration/timing/severity/associated sxs/prior Treatment) HPI Comments: CP 2PM, shoulder, arm, tightness, hurts around arm into axilla Comes and goes Nothing makes it better Worse with movement or arm Not exertional Mild SOB earlier now resolved Nausea earlier this AM, no vomiting Has happened before No fam hx of CAD No smoking, no other medical  Headache started 12 or 1PM Worswe with bright lights Front of head radiating to the back Sharp pains Didn't take anything for it Started slowly No hx of HA Teaspoon of mustard didn't help  BP high at OB office 150/100  Was diagnosed with htn before and saw specialist, was on medication in past but they stopped it because bp too low       Patient is a 29 y.o. female presenting with headaches and chest pain.  Headache Associated symptoms: nausea   Associated symptoms: no abdominal pain, no back pain, no cough, no diarrhea, no fever, no neck pain, no sore throat and no vomiting   Chest Pain Associated symptoms: headache and nausea   Associated symptoms: no abdominal pain, no back pain, no cough, no diaphoresis, no fever, no shortness of breath and not vomiting     Past Medical History  Diagnosis Date  . Anemia    Past Surgical History  Procedure Laterality Date  . Cesarean section     No family history on file. Social History  Substance Use Topics  . Smoking status: Never Smoker   . Smokeless tobacco: None  . Alcohol Use: No   OB History    No data available     Review of Systems  Constitutional: Negative for fever and diaphoresis.  HENT: Negative for sore throat.   Eyes: Negative for visual disturbance.  Respiratory: Negative for cough and shortness of breath.   Cardiovascular:  Positive for chest pain.  Gastrointestinal: Positive for nausea. Negative for vomiting, abdominal pain, diarrhea and constipation.  Genitourinary: Negative for difficulty urinating.  Musculoskeletal: Negative for back pain and neck pain.  Skin: Negative for rash.  Neurological: Positive for headaches. Negative for syncope.      Allergies  Review of patient's allergies indicates no known allergies.  Home Medications   Prior to Admission medications   Medication Sig Start Date End Date Taking? Authorizing Provider  penicillin v potassium (VEETID) 250 MG tablet Take 250 mg by mouth 4 (four) times daily.   Yes Historical Provider, MD  amLODipine (NORVASC) 2.5 MG tablet Take 1 tablet (2.5 mg total) by mouth daily. 02/26/15 03/29/15  Alvira Monday, MD   BP 146/110 mmHg  Pulse 83  Temp(Src) 98.3 F (36.8 C) (Oral)  Resp 18  Ht  (1.6 m)  Wt 148 lb (67.132 kg)  BMI 26.22 kg/m2  SpO2 100%  LMP 02/16/2015 Physical Exam  Constitutional: She is oriented to person, place, and time. She appears well-developed and well-nourished. No distress.  HENT:  Head: Normocephalic and atraumatic.  Eyes: Conjunctivae and EOM are normal.  Neck: Normal range of motion.  Cardiovascular: Normal rate, regular rhythm, normal heart sounds and intact distal pulses.  Exam reveals no gallop and no friction rub.   No murmur heard. Pulmonary/Chest: Effort normal and breath sounds normal. No respiratory distress. She has no wheezes. She has no rales.  She exhibits tenderness.  Abdominal: Soft. She exhibits no distension. There is no tenderness. There is no guarding.  Musculoskeletal: She exhibits tenderness (left upper arm). She exhibits no edema.  Neurological: She is alert and oriented to person, place, and time. She has normal strength. She displays no tremor. No cranial nerve deficit or sensory deficit. Coordination and gait normal. GCS eye subscore is 4. GCS verbal subscore is 5. GCS motor subscore is 6.   Skin: Skin is warm and dry. No rash noted. She is not diaphoretic. No erythema.  Nursing note and vitals reviewed.   ED Course  Procedures (including critical care time) Labs Review Labs Reviewed  CBC WITH DIFFERENTIAL/PLATELET - Abnormal; Notable for the following:    RBC 3.80 (*)    Hemoglobin 9.4 (*)    HCT 30.3 (*)    MCH 24.7 (*)    RDW 18.4 (*)    Neutro Abs 1.5 (*)    All other components within normal limits  COMPREHENSIVE METABOLIC PANEL - Abnormal; Notable for the following:    BUN <5 (*)    ALT 8 (*)    All other components within normal limits  TROPONIN I    Imaging Review Dg Chest 2 View  02/26/2015  CLINICAL DATA:  Headache and left-sided chest pain today. Initial encounter. EXAM: CHEST  2 VIEW COMPARISON:  Single view of the chest 01/11/2015. PA and lateral chest 11/11/2014. FINDINGS: The lungs are clear. Heart size is normal. There is no pneumothorax or pleural effusion. No focal bony abnormality is identified. IMPRESSION: No acute disease. Electronically Signed   By: Drusilla Kanner M.D.   On: 02/26/2015 18:11   I have personally reviewed and evaluated these images and lab results as part of my medical decision-making.   EKG Interpretation   Date/Time:  Tuesday February 26 2015 16:15:00 EST Ventricular Rate:  72 PR Interval:  140 QRS Duration: 88 QT Interval:  396 QTC Calculation: 433 R Axis:   76 Text Interpretation:  Normal sinus rhythm Normal ECG No significant change  since last tracing Confirmed by [facility] MD, Athony Coppa ([id number]) on 02/26/2015  6:19:42 PM      MDM   Final diagnoses:  Acute nonintractable headache, unspecified headache type  Chest pain, unspecified chest pain type  Essential hypertension  Muscle strain   29 year old female presenting with concern for headache and chest pain. Headache began slowly, no trauma, no fevers, and normal neurologic exam and have low suspicion for [facility], SDH or meningitis.  Patient was given toradol with  improvement in headache.  Patient has chest wall tenderness, pain with movement of her arm as well as arm tenderness. She reports she's been carrying her 30 pound daughter around, and have high suspicion that this is the etiology of her arm pain and chest pain. EKG was done and evaluated by me and showed no acute ST changes. Troponin was negative. Chest x-ray showed no acute disease. Have low suspicion for PE come aortic dissection, or ACS a several history, physical exam and testing. Patient is a low risk heart score.  Patient with concern for elevated blood pressures to 150s over 110 at her OB/GYN, and was hypertensive to 140/110 on arrival here with mildly elevated blood pressures of 135 systolic prior to discharge. Given concern for elevated blood pressures, will start patient on very low-dose antihypertensive until she is able to follow up with the primary care physician.  Patient discharged in stable condition with understanding of reasons to return.  Alvira MondayErin Zykeria Laguardia, MD 02/26/15 1827

## 2015-02-26 NOTE — ED Notes (Signed)
Patient transported to X-ray 

## 2015-02-26 NOTE — ED Notes (Signed)
C/o HA then CP x today since 1pm-pt NAD-steady gait

## 2015-06-14 ENCOUNTER — Encounter (HOSPITAL_BASED_OUTPATIENT_CLINIC_OR_DEPARTMENT_OTHER): Payer: Self-pay | Admitting: *Deleted

## 2015-06-14 ENCOUNTER — Emergency Department (HOSPITAL_BASED_OUTPATIENT_CLINIC_OR_DEPARTMENT_OTHER)
Admission: EM | Admit: 2015-06-14 | Discharge: 2015-06-14 | Disposition: A | Discharge status: Home / Self Care | Payer: Medicaid Other | Attending: Emergency Medicine | Admitting: Emergency Medicine

## 2015-06-14 ENCOUNTER — Emergency Department (HOSPITAL_BASED_OUTPATIENT_CLINIC_OR_DEPARTMENT_OTHER): Payer: Medicaid Other

## 2015-06-14 DIAGNOSIS — Z79899 Other long term (current) drug therapy: Secondary | ICD-10-CM | POA: Insufficient documentation

## 2015-06-14 DIAGNOSIS — R1033 Periumbilical pain: Secondary | ICD-10-CM | POA: Diagnosis present

## 2015-06-14 DIAGNOSIS — D649 Anemia, unspecified: Secondary | ICD-10-CM | POA: Insufficient documentation

## 2015-06-14 DIAGNOSIS — K429 Umbilical hernia without obstruction or gangrene: Secondary | ICD-10-CM | POA: Insufficient documentation

## 2015-06-14 DIAGNOSIS — R110 Nausea: Secondary | ICD-10-CM | POA: Insufficient documentation

## 2015-06-14 DIAGNOSIS — R109 Unspecified abdominal pain: Secondary | ICD-10-CM | POA: Diagnosis not present

## 2015-06-14 DIAGNOSIS — I10 Essential (primary) hypertension: Secondary | ICD-10-CM | POA: Insufficient documentation

## 2015-06-14 DIAGNOSIS — Z3202 Encounter for pregnancy test, result negative: Secondary | ICD-10-CM | POA: Insufficient documentation

## 2015-06-14 HISTORY — DX: Essential (primary) hypertension: I10

## 2015-06-14 LAB — COMPREHENSIVE METABOLIC PANEL
ALBUMIN: 4 g/dL (ref 3.5–5.0)
ALK PHOS: 48 U/L (ref 38–126)
ALT: 10 U/L — ABNORMAL LOW (ref 14–54)
AST: 17 U/L (ref 15–41)
Anion gap: 6 (ref 5–15)
BILIRUBIN TOTAL: 0.5 mg/dL (ref 0.3–1.2)
BUN: 7 mg/dL (ref 6–20)
CO2: 24 mmol/L (ref 22–32)
Calcium: 9.2 mg/dL (ref 8.9–10.3)
Chloride: 109 mmol/L (ref 101–111)
Creatinine, Ser: 0.79 mg/dL (ref 0.44–1.00)
GFR calc Af Amer: >60 mL/min (ref >60)
GLUCOSE: 90 mg/dL (ref 65–99)
Potassium: 3.4 mmol/L — ABNORMAL LOW (ref 3.5–5.1)
Sodium: 139 mmol/L (ref 135–145)
TOTAL PROTEIN: 7.4 g/dL (ref 6.5–8.1)

## 2015-06-14 LAB — CBC WITH DIFFERENTIAL/PLATELET
BASOS PCT: 1 %
Basophils Absolute: 0 10*3/uL (ref 0.0–0.1)
EOS PCT: 3 %
Eosinophils Absolute: 0.1 10*3/uL (ref 0.0–0.7)
HCT: 28.1 % — ABNORMAL LOW (ref 36.0–46.0)
Hemoglobin: 8.9 g/dL — ABNORMAL LOW (ref 12.0–15.0)
Lymphocytes Relative: 50 %
Lymphs Abs: 2.1 10*3/uL (ref 0.7–4.0)
MCH: 24.9 pg — AB (ref 26.0–34.0)
MCHC: 31.7 g/dL (ref 30.0–36.0)
MCV: 78.7 fL (ref 78.0–100.0)
MONO ABS: 0.2 10*3/uL (ref 0.1–1.0)
Monocytes Relative: 5 %
Neutro Abs: 1.7 10*3/uL (ref 1.7–7.7)
Neutrophils Relative %: 41 %
Platelets: 346 10*3/uL (ref 150–400)
RBC: 3.57 MIL/uL — ABNORMAL LOW (ref 3.87–5.11)
RDW: 17.1 % — AB (ref 11.5–15.5)
WBC: 4.2 10*3/uL (ref 4.0–10.5)

## 2015-06-14 LAB — URINALYSIS, ROUTINE W REFLEX MICROSCOPIC
BILIRUBIN URINE: NEGATIVE
GLUCOSE, UA: NEGATIVE mg/dL
Hgb urine dipstick: NEGATIVE
Ketones, ur: NEGATIVE mg/dL
Nitrite: NEGATIVE
Protein, ur: NEGATIVE mg/dL
Specific Gravity, Urine: 1.017 (ref 1.005–1.030)
pH: 7 (ref 5.0–8.0)

## 2015-06-14 LAB — URINE MICROSCOPIC-ADD ON: RBC / HPF: NONE SEEN RBC/hpf (ref 0–5)

## 2015-06-14 LAB — LIPASE, BLOOD: Lipase: 28 U/L (ref 11–51)

## 2015-06-14 LAB — PREGNANCY, URINE: Preg Test, Ur: NEGATIVE

## 2015-06-14 MED ORDER — SODIUM CHLORIDE 0.9 % IV SOLN: INTRAVENOUS | Status: DC

## 2015-06-14 MED ORDER — SODIUM CHLORIDE 0.9 % IV BOLUS (SEPSIS)
1000.0000 mL | Freq: Once | INTRAVENOUS | Status: AC
  Administered 2015-06-14: 1000 mL via INTRAVENOUS

## 2015-06-14 MED ORDER — FENTANYL CITRATE (PF) 100 MCG/2ML IJ SOLN
50.0000 ug | Freq: Once | INTRAMUSCULAR | Status: DC
  Filled 2015-06-14: qty 2

## 2015-06-14 MED ORDER — IOPAMIDOL (ISOVUE-300) INJECTION 61%
100.0000 mL | Freq: Once | INTRAVENOUS | Status: AC | PRN
  Administered 2015-06-14: 100 mL via INTRAVENOUS

## 2015-06-14 MED ORDER — NAPROXEN 500 MG PO TABS: 500.0000 mg | ORAL_TABLET | Freq: Two times a day (BID) | ORAL | Status: DC

## 2015-06-14 MED ORDER — ONDANSETRON HCL 4 MG/2ML IJ SOLN
4.0000 mg | Freq: Once | INTRAMUSCULAR | Status: DC
  Filled 2015-06-14: qty 2

## 2015-06-14 NOTE — ED Notes (Signed)
p with periumbilical pain x 1 day.  Hx of umbilical hernia.  Denies N/V/D.  No change in pain with palpation, eating.

## 2015-06-14 NOTE — ED Notes (Signed)
Pt refused fentanyl because she is driving. States she does not need anything right now for pain also refuses zofran, states it does not work for and she is not nauseated at this time. Dr Deretha EmoryZackowski aware.

## 2015-06-14 NOTE — Discharge Instructions (Signed)
Follow-up with your doctors for the anemia. Today's workup for the abdominal pain without any acute findings. Take the Naprosyn as directed. Return for any new or worse symptoms. Work note provided

## 2015-06-14 NOTE — ED Provider Notes (Signed)
CSN: [id number]     Arrival date & time 06/14/15  1245 History   First MD Initiated Contact with Patient 06/14/15 1304     Chief Complaint  Patient presents with  . Abdominal Pain     (Consider location/radiation/quality/duration/timing/severity/associated sxs/prior Treatment) Patient is a 30 y.o. female presenting with abdominal pain. The history is provided by the patient.  Abdominal Pain Associated symptoms: nausea   Associated symptoms: no chest pain, no dysuria, no fever, no shortness of breath and no vomiting   Patient with acute onset at 9 this morning of periumbilical abdominal pain. Patient states she has a history of an umbilical hernia. Denies any vomiting or diarrhea but states she has some nausea. No fevers no dysuria. Patient with recent history of anemia felt to be due to vaginal bleeding. Patient received blood transfusion recently and is started on iron.  Past Medical History  Diagnosis Date  . Anemia   . Hypertension    Past Surgical History  Procedure Laterality Date  . Cesarean section     History reviewed. No pertinent family history. Social History  Substance Use Topics  . Smoking status: Never Smoker   . Smokeless tobacco: None  . Alcohol Use: No   OB History    No data available     Review of Systems  Constitutional: Negative for fever.  HENT: Negative for congestion.   Eyes: Negative for visual disturbance.  Respiratory: Negative for shortness of breath.   Cardiovascular: Negative for chest pain.  Gastrointestinal: Positive for nausea and abdominal pain. Negative for vomiting.  Genitourinary: Negative for dysuria and pelvic pain.  Musculoskeletal: Negative for back pain.  Skin: Negative for rash.  Neurological: Negative for headaches.  Hematological: Does not bruise/bleed easily.  Psychiatric/Behavioral: Negative for confusion.      Allergies  Review of patient's allergies indicates no known allergies.  Home Medications   Prior to  Admission medications   Medication Sig Start Date End Date Taking? Authorizing Provider  amLODipine (NORVASC) 2.5 MG tablet Take 1 tablet (2.5 mg total) by mouth daily. 02/26/15 03/29/15  Alvira Monday, MD   BP 143/97 mmHg  Pulse 72  Temp(Src) 98.6 F (37 C) (Oral)  Resp 16  SpO2 100% Physical Exam  Constitutional: She is oriented to person, place, and time. She appears well-developed and well-nourished. No distress.  HENT:  Head: Normocephalic and atraumatic.  Mouth/Throat: Oropharynx is clear and moist.  Eyes: Conjunctivae and EOM are normal. Pupils are equal, round, and reactive to light.  Neck: Normal range of motion. Neck supple.  Cardiovascular: Normal rate, regular rhythm and normal heart sounds.   No murmur heard. Pulmonary/Chest: Effort normal and breath sounds normal.  Abdominal: Soft. Bowel sounds are normal. There is no tenderness.  Palpable umbilical hernia measuring about the 1 cm just superior to the umbilicus not containing any intra-abdominal contents.  Musculoskeletal: Normal range of motion.  Neurological: She is alert and oriented to person, place, and time. No cranial nerve deficit. She exhibits normal muscle tone. Coordination normal.  Skin: Skin is warm. No rash noted.  Nursing note and vitals reviewed.   ED Course  Procedures (including critical care time) Labs Review Labs Reviewed  URINALYSIS, ROUTINE W REFLEX MICROSCOPIC (NOT AT [facility]) - Abnormal; Notable for the following:    Leukocytes, UA MODERATE (*)    All other components within normal limits  COMPREHENSIVE METABOLIC PANEL - Abnormal; Notable for the following:    Potassium 3.4 (*)    ALT 10 (*)  All other components within normal limits  CBC WITH DIFFERENTIAL/PLATELET - Abnormal; Notable for the following:    RBC 3.57 (*)    Hemoglobin 8.9 (*)    HCT 28.1 (*)    MCH 24.9 (*)    RDW 17.1 (*)    All other components within normal limits  URINE MICROSCOPIC-ADD ON - Abnormal; Notable for  the following:    Squamous Epithelial / LPF 0-5 (*)    Bacteria, UA MANY (*)    All other components within normal limits  PREGNANCY, URINE  LIPASE, BLOOD   Results for orders placed or performed during the hospital encounter of 06/14/15  Urinalysis, Routine w reflex microscopic (not at [facility])  Result Value Ref Range   Color, Urine YELLOW YELLOW   APPearance CLEAR CLEAR   Specific Gravity, Urine 1.017 1.005 - 1.030   pH 7.0 5.0 - 8.0   Glucose, UA NEGATIVE NEGATIVE mg/dL   Hgb urine dipstick NEGATIVE NEGATIVE   Bilirubin Urine NEGATIVE NEGATIVE   Ketones, ur NEGATIVE NEGATIVE mg/dL   Protein, ur NEGATIVE NEGATIVE mg/dL   Nitrite NEGATIVE NEGATIVE   Leukocytes, UA MODERATE (A) NEGATIVE  Pregnancy, urine  Result Value Ref Range   Preg Test, Ur NEGATIVE NEGATIVE  Lipase, blood  Result Value Ref Range   Lipase 28 11 - 51 U/L  Comprehensive metabolic panel  Result Value Ref Range   Sodium 139 135 - 145 mmol/L   Potassium 3.4 (L) 3.5 - 5.1 mmol/L   Chloride 109 101 - 111 mmol/L   CO2 24 22 - 32 mmol/L   Glucose, Bld 90 65 - 99 mg/dL   BUN 7 6 - 20 mg/dL   Creatinine, Ser 4.090.79 0.44 - 1.00 mg/dL   Calcium 9.2 8.9 - 81.110.3 mg/dL   Total Protein 7.4 6.5 - 8.1 g/dL   Albumin 4.0 3.5 - 5.0 g/dL   AST 17 15 - 41 U/L   ALT 10 (L) 14 - 54 U/L   Alkaline Phosphatase 48 38 - 126 U/L   Total Bilirubin 0.5 0.3 - 1.2 mg/dL   GFR calc non Af Amer >60 >60 mL/min   GFR calc Af Amer >60 >60 mL/min   Anion gap 6 5 - 15  CBC with Differential/Platelet  Result Value Ref Range   WBC 4.2 4.0 - 10.5 K/uL   RBC 3.57 (L) 3.87 - 5.11 MIL/uL   Hemoglobin 8.9 (L) 12.0 - 15.0 g/dL   HCT 91.428.1 (L) 78.236.0 - 95.646.0 %   MCV 78.7 78.0 - 100.0 fL   MCH 24.9 (L) 26.0 - 34.0 pg   MCHC 31.7 30.0 - 36.0 g/dL   RDW 21.317.1 (H) 08.611.5 - 57.815.5 %   Platelets 346 150 - 400 K/uL   Neutrophils Relative % 41 %   Neutro Abs 1.7 1.7 - 7.7 K/uL   Lymphocytes Relative 50 %   Lymphs Abs 2.1 0.7 - 4.0 K/uL   Monocytes Relative 5  %   Monocytes Absolute 0.2 0.1 - 1.0 K/uL   Eosinophils Relative 3 %   Eosinophils Absolute 0.1 0.0 - 0.7 K/uL   Basophils Relative 1 %   Basophils Absolute 0.0 0.0 - 0.1 K/uL  Urine microscopic-add on  Result Value Ref Range   Squamous Epithelial / LPF 0-5 (A) NONE SEEN   WBC, UA 0-5 0 - 5 WBC/hpf   RBC / HPF NONE SEEN 0 - 5 RBC/hpf   Bacteria, UA MANY (A) NONE SEEN    Imaging Review Ct  Abdomen Pelvis W Contrast  06/14/2015  CLINICAL DATA:  Periumbilical pain for 1 day. History of umbilical hernia EXAM: CT ABDOMEN AND PELVIS WITH CONTRAST TECHNIQUE: Multidetector CT imaging of the abdomen and pelvis was performed using the standard protocol following bolus administration of intravenous contrast. CONTRAST:  100 cc Isovue 300 IV COMPARISON:  None. FINDINGS: Lower chest: Lung bases are clear. No effusions. Heart is normal size. Hepatobiliary: 2 hyperdense areas within the liver, 16 mm in the left hepatic lobe and 15 mm in the right hepatic lobe, both on image 13. These may reflect flash filling of hemangiomas or other benign process. Gallbladder unremarkable. Pancreas: Normal appearance Spleen: Normal appearance Adrenals/Urinary Tract: No adrenal abnormality. No focal renal abnormality. No stones or hydronephrosis. Urinary bladder is unremarkable. Stomach/Bowel: Appendix is normal. Stomach, large and small bowel grossly unremarkable. Vascular/Lymphatic: No evidence of aneurysm or adenopathy. Reproductive: Uterus and adnexa unremarkable. No mass. Small follicles in the right ovary Other: Small amount of free fluid in the pelvis.  No free air Musculoskeletal: No acute bony abnormality or focal bone lesion. IMPRESSION: No acute findings or significant abnormality in the abdomen or pelvis. Electronically Signed   By: Charlett Nose M.D.   On: 06/14/2015 14:15   I have personally reviewed and evaluated these images and lab results as part of my medical decision-making.   EKG Interpretation None       MDM   Final diagnoses:  Abdominal pain, unspecified abdominal location  Anemia, unspecified anemia type    Workup for the abdominal pain. Without any significant findings. Patient with acute onset of apparent buckle bowel pain at o'clock this morning. Patient with history of umbilical hernia. Clinically there is evidence of a umbilical hernia without any incarceration. Did not show up on CT scan. Patient's labs without sniffing abnormality. Other than anemia. Patient is known to have that recently had a blood transfusion for anemia and has been started on iron. Patient was treated with Naprosyn and follow-up with her record Dr.    Vanetta Mulders, MD 06/14/15 [phone]

## 2015-09-29 ENCOUNTER — Encounter (HOSPITAL_BASED_OUTPATIENT_CLINIC_OR_DEPARTMENT_OTHER): Payer: Self-pay | Admitting: *Deleted

## 2015-09-29 ENCOUNTER — Emergency Department (HOSPITAL_BASED_OUTPATIENT_CLINIC_OR_DEPARTMENT_OTHER)
Admission: EM | Admit: 2015-09-29 | Discharge: 2015-09-29 | Disposition: A | Discharge status: Home / Self Care | Payer: Medicaid Other | Attending: Emergency Medicine | Admitting: Emergency Medicine

## 2015-09-29 DIAGNOSIS — I10 Essential (primary) hypertension: Secondary | ICD-10-CM | POA: Diagnosis present

## 2015-09-29 DIAGNOSIS — Z79899 Other long term (current) drug therapy: Secondary | ICD-10-CM | POA: Diagnosis not present

## 2015-09-29 DIAGNOSIS — R51 Headache: Secondary | ICD-10-CM | POA: Insufficient documentation

## 2015-09-29 DIAGNOSIS — R519 Headache, unspecified: Secondary | ICD-10-CM

## 2015-09-29 MED ORDER — IBUPROFEN 400 MG PO TABS
600.0000 mg | ORAL_TABLET | Freq: Once | ORAL | Status: AC
  Administered 2015-09-29: 600 mg via ORAL
  Filled 2015-09-29: qty 1

## 2015-09-29 NOTE — Discharge Instructions (Signed)
DASH Eating Plan °DASH stands for "Dietary Approaches to Stop Hypertension." The DASH eating plan is a healthy eating plan that has been shown to reduce high blood pressure (hypertension). Additional health benefits may include reducing the risk of type 2 diabetes mellitus, heart disease, and stroke. The DASH eating plan may also help with weight loss. °WHAT DO I NEED TO KNOW ABOUT THE DASH EATING PLAN? °For the DASH eating plan, you will follow these general guidelines: °· Choose foods with a percent daily value for sodium of less than 5% (as listed on the food label). °· Use salt-free seasonings or herbs instead of table salt or sea salt. °· Check with your health care provider or pharmacist before using salt substitutes. °· Eat lower-sodium products, often labeled as "lower sodium" or "no salt added." °· Eat fresh foods. °· Eat more vegetables, fruits, and low-fat dairy products. °· Choose whole grains. Look for the word "whole" as the first word in the ingredient list. °· Choose fish and skinless chicken or turkey more often than red meat. Limit fish, poultry, and meat to 6 oz (170 g) each day. °· Limit sweets, desserts, sugars, and sugary drinks. °· Choose heart-healthy fats. °· Limit cheese to 1 oz (28 g) per day. °· Eat more home-cooked food and less restaurant, buffet, and fast food. °· Limit fried foods. °· Cook foods using methods other than frying. °· Limit canned vegetables. If you do use them, rinse them well to decrease the sodium. °· When eating at a restaurant, ask that your food be prepared with less salt, or no salt if possible. °WHAT FOODS CAN I EAT? °Seek help from a dietitian for individual calorie needs. °Grains °Whole grain or whole wheat bread. Brown rice. Whole grain or whole wheat pasta. Quinoa, bulgur, and whole grain cereals. Low-sodium cereals. Corn or whole wheat flour tortillas. Whole grain cornbread. Whole grain crackers. Low-sodium crackers. °Vegetables °Fresh or frozen vegetables  (raw, steamed, roasted, or grilled). Low-sodium or reduced-sodium tomato and vegetable juices. Low-sodium or reduced-sodium tomato sauce and paste. Low-sodium or reduced-sodium canned vegetables.  °Fruits °All fresh, canned (in natural juice), or frozen fruits. °Meat and Other Protein Products °Ground beef (85% or leaner), grass-fed beef, or beef trimmed of fat. Skinless chicken or turkey. Ground chicken or turkey. Pork trimmed of fat. All fish and seafood. Eggs. Dried beans, peas, or lentils. Unsalted nuts and seeds. Unsalted canned beans. °Dairy °Low-fat dairy products, such as skim or 1% milk, 2% or reduced-fat cheeses, low-fat ricotta or cottage cheese, or plain low-fat yogurt. Low-sodium or reduced-sodium cheeses. °Fats and Oils °Tub margarines without trans fats. Light or reduced-fat mayonnaise and salad dressings (reduced sodium). Avocado. Safflower, olive, or canola oils. Natural peanut or almond butter. °Other °Unsalted popcorn and pretzels. °The items listed above may not be a complete list of recommended foods or beverages. Contact your dietitian for more options. °WHAT FOODS ARE NOT RECOMMENDED? °Grains °White bread. White pasta. White rice. Refined cornbread. Bagels and croissants. Crackers that contain trans fat. °Vegetables °Creamed or fried vegetables. Vegetables in a cheese sauce. Regular canned vegetables. Regular canned tomato sauce and paste. Regular tomato and vegetable juices. °Fruits °Dried fruits. Canned fruit in light or heavy syrup. Fruit juice. °Meat and Other Protein Products °Fatty cuts of meat. Ribs, chicken wings, bacon, sausage, bologna, salami, chitterlings, fatback, hot dogs, bratwurst, and packaged luncheon meats. Salted nuts and seeds. Canned beans with salt. °Dairy °Whole or 2% milk, cream, half-and-half, and cream cheese. Whole-fat or sweetened yogurt. Full-fat   cheeses or blue cheese. Nondairy creamers and whipped toppings. Processed cheese, cheese spreads, or cheese  curds. °Condiments °Onion and garlic salt, seasoned salt, table salt, and sea salt. Canned and packaged gravies. Worcestershire sauce. Tartar sauce. Barbecue sauce. Teriyaki sauce. Soy sauce, including reduced sodium. Steak sauce. Fish sauce. Oyster sauce. Cocktail sauce. Horseradish. Ketchup and mustard. Meat flavorings and tenderizers. Bouillon cubes. Hot sauce. Tabasco sauce. Marinades. Taco seasonings. Relishes. °Fats and Oils °Butter, stick margarine, lard, shortening, ghee, and bacon fat. Coconut, palm kernel, or palm oils. Regular salad dressings. °Other °Pickles and olives. Salted popcorn and pretzels. °The items listed above may not be a complete list of foods and beverages to avoid. Contact your dietitian for more information. °WHERE CAN I FIND MORE INFORMATION? °[employer], [employer]: www.nhlbi.nih.gov/health/health-topics/topics/dash/ °  °This information is not intended to replace advice given to you by your health care provider. Make sure you discuss any questions you have with your health care provider. °  °Document Released: 02/19/2011 Document Revised: 03/23/2014 Document Reviewed: 01/04/2013 °Elsevier Interactive Patient Education ©2016 [employer] ° °Hypertension °Hypertension, commonly called high blood pressure, is when the force of blood pumping through your arteries is too strong. Your arteries are the blood vessels that carry blood from your heart throughout your body. A blood pressure reading consists of a higher number over a lower number, such as 110/72. The higher number (systolic) is the pressure inside your arteries when your heart pumps. The lower number (diastolic) is the pressure inside your arteries when your heart relaxes. Ideally you want your blood pressure below 120/80. °Hypertension forces your heart to work harder to pump blood. Your arteries may become narrow or stiff. Having untreated or uncontrolled hypertension can cause heart attack, stroke, kidney  disease, and other problems. °RISK FACTORS °Some risk factors for high blood pressure are controllable. Others are not.  °Risk factors you cannot control include:  °· Race. You may be at higher risk if you are African American. °· Age. Risk increases with age. °· Gender. Men are at higher risk than women before age 45 years. After age 65, women are at higher risk than men. °Risk factors you can control include: °· Not getting enough exercise or physical activity. °· Being overweight. °· Getting too much fat, sugar, calories, or salt in your diet. °· Drinking too much alcohol. °SIGNS AND SYMPTOMS °Hypertension does not usually cause signs or symptoms. Extremely high blood pressure (hypertensive crisis) may cause headache, anxiety, shortness of breath, and nosebleed. °DIAGNOSIS °To check if you have hypertension, your health care provider will measure your blood pressure while you are seated, with your arm held at the level of your heart. It should be measured at least twice using the same arm. Certain conditions can cause a difference in blood pressure between your right and left arms. A blood pressure reading that is higher than normal on one occasion does not mean that you need treatment. If it is not clear whether you have high blood pressure, you may be asked to return on a different day to have your blood pressure checked again. Or, you may be asked to monitor your blood pressure at home for 1 or more weeks. °TREATMENT °Treating high blood pressure includes making lifestyle changes and possibly taking medicine. Living a healthy lifestyle can help lower high blood pressure. You may need to change some of your habits. °Lifestyle changes may include: °· Following the DASH diet. This diet is high in fruits, vegetables, and whole   grains. It is low in salt, red meat, and added sugars. °· Keep your sodium intake below 2,300 mg per day. °· Getting at least 30-45 minutes of aerobic exercise at least 4 times per  week. °· Losing weight if necessary. °· Not smoking. °· Limiting alcoholic beverages. °· Learning ways to reduce stress. °Your health care provider may prescribe medicine if lifestyle changes are not enough to get your blood pressure under control, and if one of the following is true: °· You are 18-59 years of age and your systolic blood pressure is above 140. °· You are 60 years of age or older, and your systolic blood pressure is above 150. °· Your diastolic blood pressure is above 90. °· You have diabetes, and your systolic blood pressure is over 140 or your diastolic blood pressure is over 90. °· You have kidney disease and your blood pressure is above 140/90. °· You have heart disease and your blood pressure is above 140/90. °Your personal target blood pressure may vary depending on your medical conditions, your age, and other factors. °HOME CARE INSTRUCTIONS °· Have your blood pressure rechecked as directed by your health care provider.   °· Take medicines only as directed by your health care provider. Follow the directions carefully. Blood pressure medicines must be taken as prescribed. The medicine does not work as well when you skip doses. Skipping doses also puts you at risk for problems. °· Do not smoke.   °· Monitor your blood pressure at home as directed by your health care provider.  °SEEK MEDICAL CARE IF:  °· You think you are having a reaction to medicines taken. °· You have recurrent headaches or feel dizzy. °· You have swelling in your ankles. °· You have trouble with your vision. °SEEK IMMEDIATE MEDICAL CARE IF: °· You develop a severe headache or confusion. °· You have unusual weakness, numbness, or feel faint. °· You have severe chest or abdominal pain. °· You vomit repeatedly. °· You have trouble breathing. °MAKE SURE YOU:  °· Understand these instructions. °· Will watch your condition. °· Will get help right away if you are not doing well or get worse. °  °This information is not intended to  replace advice given to you by your health care provider. Make sure you discuss any questions you have with your health care provider. °  °Document Released: 03/02/2005 Document Revised: 07/17/2014 Document Reviewed: 12/23/2012 °Elsevier Interactive Patient Education ©2016 Elsevier Inc. ° °

## 2015-09-29 NOTE — ED Notes (Signed)
Dr Kohut in room with patient now. 

## 2015-09-29 NOTE — ED Notes (Signed)
Pt smiling, conversational and ambulates without difficulty.  Reviewed discharge instructions with patient, including reasons to seek immediate emergency care.  Pt verbalized understanding.

## 2015-09-29 NOTE — ED Notes (Signed)
At approx 1215pm pt states was making a bed at work today, had been bent over, stood up and suddenly had a sharp posterior headache.  Pt states since then headache has now spread across front of her forehead.

## 2015-09-29 NOTE — ED Provider Notes (Signed)
CSN: [id number]     Arrival date & time 09/29/15  1356 History  By signing my name below, I, Levon Hedger, attest that this documentation has been prepared under the direction and in the presence of Raeford Razor, MD . Electronically Signed: Levon Hedger, Scribe. 09/29/2015. 4:05 PM.   Chief Complaint  Patient presents with  . Hypertension   The history is provided by the patient. No language interpreter was used.    HPI Comments:  Anna Brock is a 30 y.o. female with PMHx of anemia and HTNwho presents to the Emergency Department complaining of sudden onset, sharp, posterior headache onset about 4 hours ago.Pt states she was making a bed at work, stood up, and suddenly had a headache. Pt notes associated high blood pressure. Per pt, she had eaten a peanut butter cracker approximately 10 minutes before time of onset. She checked her blood pressure and reports it was 146/101. Pt takes amlodipine for HTN since December. No alleviating factors noted. She denies any  swelling, CP, fever, or nausea.   Past Medical History  Diagnosis Date  . Anemia   . Hypertension    Past Surgical History  Procedure Laterality Date  . Cesarean section     History reviewed. No pertinent family history. Social History  Substance Use Topics  . Smoking status: Never Smoker   . Smokeless tobacco: None  . Alcohol Use: No   OB History    No data available     Review of Systems  Constitutional: Negative for fever.  Cardiovascular: Negative for chest pain and leg swelling.  Gastrointestinal: Negative for nausea.  Neurological: Positive for headaches.  All other systems reviewed and are negative.   Allergies  Review of patient's allergies indicates no known allergies.  Home Medications   Prior to Admission medications   Medication Sig Start Date End Date Taking? Authorizing Provider  amLODipine (NORVASC) 2.5 MG tablet Take 1 tablet (2.5 mg total) by mouth daily. 02/26/15 03/29/15  Alvira Monday, MD  naproxen (NAPROSYN) 500 MG tablet Take 1 tablet (500 mg total) by mouth 2 (two) times daily. 06/14/15   Vanetta Mulders, MD   BP 127/103 mmHg  Pulse 55  Temp(Src) 97.7 F (36.5 C) (Oral)  Resp 16  Ht  (1.6 m)  Wt 140 lb (63.504 kg)  BMI 24.81 kg/m2  SpO2 100% Physical Exam  Constitutional: She is oriented to person, place, and time. She appears well-developed and well-nourished. No distress.  HENT:  Head: Normocephalic and atraumatic.  Eyes: Conjunctivae are normal.  Neck:  No nuchal rigidity  Cardiovascular: Normal rate.   Pulmonary/Chest: Effort normal.  Abdominal: She exhibits no distension.  Neurological: She is alert and oriented to person, place, and time. She displays normal reflexes. No cranial nerve deficit. She exhibits normal muscle tone. Coordination normal.  Good finger to nose bilaterally  Skin: Skin is warm and dry.  Psychiatric: She has a normal mood and affect.  Nursing note and vitals reviewed.   ED Course  Procedures  DIAGNOSTIC STUDIES:  Oxygen Saturation is 100% on RA, normal by my interpretation.    COORDINATION OF CARE:  4:05 PM Will order ibuprofen for headache. Discussed treatment plan with pt at bedside and pt agreed to plan.  Labs Review Labs Reviewed - No data to display  Imaging Review No results found. I have personally reviewed and evaluated these images and lab results as part of my medical decision-making.   EKG Interpretation None  MDM   Final diagnoses:  Essential hypertension  Nonintractable headache, unspecified chronicity pattern, unspecified headache type    29yF with headache. Suspect primary HA. Consider emergent secondary causes such as bleed, infectious or mass but doubt. She is concerned about her blood pressure but I doubt headache is directly related to her blood pressure. Advised to get a cuff and keep a record to take to her next PCP appointment. There is no history of trauma. Pt has a  nonfocal neurological exam. Afebrile and neck supple. No use of blood thinning medication. Consider ocular etiology such as acute angle closure glaucoma but doubt. Pt denies acute change in visual acuity and eye exam unremarkable. Doubt temporal arteritis given age, no temporal tenderness and temporal artery pulsations palpable. Doubt CO poisoning. No contacts with similar symptoms. Doubt venous thrombosis. Doubt carotid or vertebral arteries dissection. Symptoms improved with meds. Feel that can be safely discharged, but strict return precautions discussed. Outpt fu.  I personally preformed the services scribed in my presence. The recorded information has been reviewed is accurate. Raeford RazorStephen Kaire Stary, MD.     Raeford RazorStephen May Ozment, MD 09/29/15 [phone]

## 2015-09-29 NOTE — ED Notes (Signed)
States that she was just started on BP meds in December and that she ate PB crackers today that made her BP go up.

## 2015-09-29 NOTE — ED Notes (Signed)
Pt reports htn while at work today.

## 2015-12-13 ENCOUNTER — Emergency Department (HOSPITAL_BASED_OUTPATIENT_CLINIC_OR_DEPARTMENT_OTHER)
Admission: EM | Admit: 2015-12-13 | Discharge: 2015-12-13 | Disposition: A | Discharge status: Home / Self Care | Payer: Medicaid Other | Attending: Emergency Medicine | Admitting: Emergency Medicine

## 2015-12-13 ENCOUNTER — Encounter (HOSPITAL_BASED_OUTPATIENT_CLINIC_OR_DEPARTMENT_OTHER): Payer: Self-pay

## 2015-12-13 DIAGNOSIS — Z79899 Other long term (current) drug therapy: Secondary | ICD-10-CM | POA: Insufficient documentation

## 2015-12-13 DIAGNOSIS — R51 Headache: Secondary | ICD-10-CM | POA: Insufficient documentation

## 2015-12-13 DIAGNOSIS — I10 Essential (primary) hypertension: Secondary | ICD-10-CM | POA: Diagnosis not present

## 2015-12-13 DIAGNOSIS — R519 Headache, unspecified: Secondary | ICD-10-CM

## 2015-12-13 LAB — URINALYSIS, ROUTINE W REFLEX MICROSCOPIC
BILIRUBIN URINE: NEGATIVE
GLUCOSE, UA: NEGATIVE mg/dL
HGB URINE DIPSTICK: NEGATIVE
Ketones, ur: NEGATIVE mg/dL
Nitrite: NEGATIVE
PROTEIN: NEGATIVE mg/dL
Specific Gravity, Urine: 1.012 (ref 1.005–1.030)
pH: 7 (ref 5.0–8.0)

## 2015-12-13 LAB — URINE MICROSCOPIC-ADD ON: RBC / HPF: NONE SEEN RBC/hpf (ref 0–5)

## 2015-12-13 LAB — PREGNANCY, URINE: PREG TEST UR: NEGATIVE

## 2015-12-13 MED ORDER — KETOROLAC TROMETHAMINE 30 MG/ML IJ SOLN
30.0000 mg | Freq: Once | INTRAMUSCULAR | Status: DC
  Filled 2015-12-13: qty 1

## 2015-12-13 NOTE — ED Provider Notes (Signed)
[facility]   CSN: [id number] Arrival date & time: 12/13/15  1402     History   Chief Complaint Chief Complaint  Patient presents with  . Headache    HPI Anna Brock is a 30 y.o. female.  HPI   30 year old female presents today with complaints of headache and sinus congestion. Patient reports yesterday she developed bilateral sinus congestion typical of allergies. She noted frontal headache thereafter. She reports symptoms have continued to persist, not relieved with over-the-counter medications at home. Patient denies any headache red flags, fever, severe sinus symptoms, or any other concerning signs or symptoms. Patient tolerating by mouth without difficulty.   Past Medical History:  Diagnosis Date  . Anemia   . Hypertension     There are no active problems to display for this patient.   Past Surgical History:  Procedure Laterality Date  . CESAREAN SECTION      OB History    No data available      Home Medications    Prior to Admission medications   Medication Sig Start Date End Date Taking? Authorizing Provider  amLODipine (NORVASC) 2.5 MG tablet Take 1 tablet (2.5 mg total) by mouth daily. 02/26/15 03/29/15  Alvira Monday, MD    Family History No family history on file.  Social History Social History  Substance Use Topics  . Smoking status: Never Smoker  . Smokeless tobacco: Never Used  . Alcohol use No     Allergies   Review of patient's allergies indicates no known allergies.   Review of Systems Review of Systems  All other systems reviewed and are negative.    Physical Exam Updated Vital Signs BP 128/94 (BP Location: Right Arm)   Pulse 84   Temp 98.9 F (37.2 C) (Oral)   Resp 18   Ht 5\' 3"  (1.6 m)   Wt 66.7 kg   LMP  (LMP Unknown)   SpO2 100%   BMI 26.04 kg/m   Physical Exam  Constitutional: She is oriented to person, place, and time. She appears well-developed and well-nourished. No distress.   HENT:  Head: Normocephalic.  Eyes: Conjunctivae and EOM are normal. Pupils are equal, round, and reactive to light. Right eye exhibits no discharge. Left eye exhibits no discharge. No scleral icterus.  Neck: Normal range of motion. Neck supple. No JVD present.  Pulmonary/Chest: No stridor.  Musculoskeletal: Normal range of motion. She exhibits no edema or tenderness.  Lymphadenopathy:    She has no cervical adenopathy.  Neurological: She is alert and oriented to person, place, and time. She has normal strength. She displays no atrophy and no tremor. No cranial nerve deficit or sensory deficit. She exhibits normal muscle tone. She displays a negative Romberg sign. She displays no seizure activity. Coordination and gait normal. GCS eye subscore is 4. GCS verbal subscore is 5. GCS motor subscore is 6.  Skin: She is not diaphoretic.  Nursing note and vitals reviewed.    ED Treatments / Results  Labs (all labs ordered are listed, but only abnormal results are displayed) Labs Reviewed  URINALYSIS, ROUTINE W REFLEX MICROSCOPIC (NOT AT [facility]) - Abnormal; Notable for the following:       Result Value   Leukocytes, UA TRACE (*)    All other components within normal limits  URINE MICROSCOPIC-ADD ON - Abnormal; Notable for the following:    Squamous Epithelial / LPF 0-5 (*)    Bacteria, UA MANY (*)    All other components within  normal limits  PREGNANCY, URINE    EKG  EKG Interpretation None       Radiology No results found.  Procedures Procedures (including critical care time)  Medications Ordered in ED Medications  ketorolac (TORADOL) 30 MG/ML injection 30 mg (30 mg Intramuscular Refused 12/13/15 1506)     Initial Impression / Assessment and Plan / ED Course  I have reviewed the triage vital signs and the nursing notes.  Pertinent labs & imaging results that were available during my care of the patient were reviewed by me and considered in my medical decision making (see  chart for details).  Clinical Course     Final Clinical Impressions(s) / ED Diagnoses   Final diagnoses:  Nonintractable headache, unspecified chronicity pattern, unspecified headache type    Labs:  Imaging:  Consults:  Therapeutics:  Discharge Meds:   Assessment/Plan:  30 year old female presents today with headache. Consistent with sinus headache. No red flags. Toradol ordered for patient notes she initially had headache symptoms. Nursing staff notified me the patient no longer had headache, did not want the Toradol, wanted a pregnancy test and to be discharged.. Patient discharged home symptomatic care instructions strict return precautions. No signs of infectious etiology.   New Prescriptions New Prescriptions   No medications on file     Eyvonne MechanicJeffrey Tarvares Lant, PA-C 12/13/15 1557    Geoffery Lyonsouglas Delo, MD 12/14/15 [phone]

## 2015-12-13 NOTE — ED Notes (Signed)
PA at bedside.

## 2015-12-13 NOTE — Discharge Instructions (Signed)
Please read attached information. If you experience any new or worsening signs or symptoms please return to the emergency room for evaluation. Please follow-up with your primary care provider or specialist as discussed.  °

## 2015-12-13 NOTE — ED Notes (Signed)
Pt in bathroom

## 2015-12-13 NOTE — ED Triage Notes (Signed)
C/o HA started yesterday-NAD-steady gait 

## 2016-05-01 ENCOUNTER — Emergency Department (HOSPITAL_BASED_OUTPATIENT_CLINIC_OR_DEPARTMENT_OTHER)
Admission: EM | Admit: 2016-05-01 | Discharge: 2016-05-01 | Disposition: A | Discharge status: Home / Self Care | Payer: Medicaid Other | Attending: Emergency Medicine | Admitting: Emergency Medicine

## 2016-05-01 ENCOUNTER — Encounter (HOSPITAL_BASED_OUTPATIENT_CLINIC_OR_DEPARTMENT_OTHER): Payer: Self-pay

## 2016-05-01 DIAGNOSIS — I10 Essential (primary) hypertension: Secondary | ICD-10-CM | POA: Insufficient documentation

## 2016-05-01 DIAGNOSIS — K029 Dental caries, unspecified: Secondary | ICD-10-CM | POA: Diagnosis not present

## 2016-05-01 DIAGNOSIS — Z79899 Other long term (current) drug therapy: Secondary | ICD-10-CM | POA: Insufficient documentation

## 2016-05-01 DIAGNOSIS — K0889 Other specified disorders of teeth and supporting structures: Secondary | ICD-10-CM | POA: Diagnosis present

## 2016-05-01 DIAGNOSIS — R51 Headache: Secondary | ICD-10-CM | POA: Diagnosis not present

## 2016-05-01 MED ORDER — HYDROCODONE-ACETAMINOPHEN 5-325 MG PO TABS: 1.0000 | ORAL_TABLET | Freq: Four times a day (QID) | ORAL | 0 refills | Status: DC | PRN

## 2016-05-01 MED ORDER — PENICILLIN V POTASSIUM 500 MG PO TABS
500.0000 mg | ORAL_TABLET | Freq: Three times a day (TID) | ORAL | 0 refills | Status: DC
Start: 2016-05-01 — End: 2019-05-10

## 2016-05-01 NOTE — Discharge Instructions (Signed)
Penicillin as prescribed.  Hydrocodone as prescribed as needed for pain.  Follow-up with dentistry next week, and return to the ER if you develop worsening pain, difficulty breathing or swallowing, or other new and concerning symptoms.

## 2016-05-01 NOTE — ED Provider Notes (Signed)
[facility]   CSN: [id number] Arrival date & time: 05/01/16  [id number]  By signing my name below, I, Modena JanskyAlbert Thayil, attest that this documentation has been prepared under the direction and in the presence of Geoffery Lyonsouglas Payal Stanforth, MD. Electronically Signed: Modena JanskyAlbert Thayil, Scribe. 05/01/2016. 7:37 PM.  History   Chief Complaint Chief Complaint  Patient presents with  . Headache   The history is provided by the patient. No language interpreter was used.  Headache   This is a new problem. The current episode started 2 days ago. The problem occurs constantly. The problem has not changed since onset.The pain is located in the left unilateral region. The pain is moderate. The pain radiates to the face. Anna Brock has tried acetaminophen for the symptoms. The treatment provided mild relief.   HPI Comments: Anna Brock is a 31 y.o. female who presents to the Emergency Department complaining of constant moderate left-sided headache that started 2 days ago. Anna Brock currently has a dental pain that is related to her headache. Anna Brock was seen by her dentist for the complaint but was never prescribed antibiotics. Her pain is relieved by tylenol and exacerbated by eating. Anna Brock denies any visual disturbance or other complaints.   Past Medical History:  Diagnosis Date  . Anemia   . Hypertension     There are no active problems to display for this patient.   Past Surgical History:  Procedure Laterality Date  . CESAREAN SECTION      OB History    No data available       Home Medications    Prior to Admission medications   Medication Sig Start Date End Date Taking? Authorizing Provider  amLODipine (NORVASC) 2.5 MG tablet Take 1 tablet (2.5 mg total) by mouth daily. 02/26/15 03/29/15  Alvira MondayErin Schlossman, MD    Family History No family history on file.  Social History Social History  Substance Use Topics  . Smoking status: Never Smoker  . Smokeless tobacco: Never Used  . Alcohol use  No     Allergies   Patient has no known allergies.   Review of Systems Review of Systems  HENT: Positive for dental problem.   Eyes: Negative for visual disturbance.  Neurological: Positive for headaches.  All other systems reviewed and are negative.    Physical Exam Updated Vital Signs BP 135/92 (BP Location: Left Arm)   Pulse 88   Temp 98.9 F (37.2 C) (Oral)   Resp 18   Ht 5\' 3"  (1.6 m)   Wt 140 lb (63.5 kg)   LMP 04/16/2016   SpO2 100%   BMI 24.80 kg/m   Physical Exam  Constitutional: Anna Brock is oriented to person, place, and time. Anna Brock appears well-developed and well-nourished.  HENT:  Head: Normocephalic.  Rear lower molar is heavily decayed with surrounding erythema and swelling. No obvious abscess.   Eyes: EOM are normal.  Neck: Normal range of motion.  Cardiovascular: Normal rate.   Pulmonary/Chest: Effort normal.  Abdominal: Anna Brock exhibits no distension.  Musculoskeletal: Normal range of motion.  Neurological: Anna Brock is alert and oriented to person, place, and time.  Psychiatric: Anna Brock has a normal mood and affect.  Nursing note and vitals reviewed.    ED Treatments / Results  DIAGNOSTIC STUDIES: Oxygen Saturation is 100% on RA, normal by my interpretation.    COORDINATION OF CARE: 7:41 PM- Pt advised of plan for treatment and pt agrees.  Labs (all labs ordered are listed, but only abnormal results are displayed)  Labs Reviewed - No data to display  EKG  EKG Interpretation None       Radiology No results found.  Procedures Procedures (including critical care time)  Medications Ordered in ED Medications - No data to display   Initial Impression / Assessment and Plan / ED Course  I have reviewed the triage vital signs and the nursing notes.  Pertinent labs & imaging results that were available during my care of the patient were reviewed by me and considered in my medical decision making (see chart for details).  Patient has a heavily decayed  left rear molar with surrounding swelling and inflammation. Anna Brock will be treated with penicillin, pain medication, and is to follow-up with her dentist. There is no evidence for stridor, trismus, or any respiratory compromise.  Final Clinical Impressions(s) / ED Diagnoses   Final diagnoses:  Pain due to dental caries    New Prescriptions New Prescriptions   No medications on file   I personally performed the services described in this documentation, which was scribed in my presence. The recorded information has been reviewed and is accurate.        Geoffery Lyons, MD 05/01/16 2142

## 2016-05-01 NOTE — ED Triage Notes (Signed)
C/o HA x 2 days-NAD-steady gait

## 2016-05-23 ENCOUNTER — Emergency Department (HOSPITAL_BASED_OUTPATIENT_CLINIC_OR_DEPARTMENT_OTHER): Payer: Medicaid Other

## 2016-05-23 ENCOUNTER — Encounter (HOSPITAL_BASED_OUTPATIENT_CLINIC_OR_DEPARTMENT_OTHER): Payer: Self-pay | Admitting: Emergency Medicine

## 2016-05-23 ENCOUNTER — Emergency Department (HOSPITAL_BASED_OUTPATIENT_CLINIC_OR_DEPARTMENT_OTHER)
Admission: EM | Admit: 2016-05-23 | Discharge: 2016-05-23 | Disposition: A | Discharge status: Home / Self Care | Payer: Medicaid Other | Attending: Emergency Medicine | Admitting: Emergency Medicine

## 2016-05-23 DIAGNOSIS — J111 Influenza due to unidentified influenza virus with other respiratory manifestations: Secondary | ICD-10-CM

## 2016-05-23 DIAGNOSIS — R69 Illness, unspecified: Secondary | ICD-10-CM

## 2016-05-23 DIAGNOSIS — R05 Cough: Secondary | ICD-10-CM | POA: Insufficient documentation

## 2016-05-23 DIAGNOSIS — Z79899 Other long term (current) drug therapy: Secondary | ICD-10-CM | POA: Diagnosis not present

## 2016-05-23 DIAGNOSIS — I10 Essential (primary) hypertension: Secondary | ICD-10-CM | POA: Diagnosis not present

## 2016-05-23 MED ORDER — IBUPROFEN 800 MG PO TABS
800.0000 mg | ORAL_TABLET | Freq: Once | ORAL | Status: AC
  Administered 2016-05-23: 800 mg via ORAL
  Filled 2016-05-23: qty 1

## 2016-05-23 MED ORDER — IBUPROFEN 800 MG PO TABS: 800.0000 mg | ORAL_TABLET | Freq: Three times a day (TID) | ORAL | 0 refills | Status: DC

## 2016-05-23 MED ORDER — IBUPROFEN 800 MG PO TABS: 800.0000 mg | ORAL_TABLET | Freq: Once | ORAL | Status: DC

## 2016-05-23 MED ORDER — OSELTAMIVIR PHOSPHATE 75 MG PO CAPS: 75.0000 mg | ORAL_CAPSULE | Freq: Two times a day (BID) | ORAL | 0 refills | Status: DC

## 2016-05-23 NOTE — ED Notes (Signed)
Pt discharged to home NAD.  

## 2016-05-23 NOTE — ED Triage Notes (Signed)
Woke up with body aches and  Chills.

## 2016-05-23 NOTE — ED Notes (Signed)
ED Provider at bedside. 

## 2016-05-24 NOTE — ED Provider Notes (Signed)
[facility]-EMERGENCY DEPT Provider Note   CSN: [id number] Arrival date & time: 05/23/16  1116     History   Chief Complaint Chief Complaint  Patient presents with  . Generalized Body Aches    HPI Anna Brock is a 31 y.o. female.  The history is provided by the patient. No language interpreter was used.  Cough  This is a new problem. The problem occurs constantly. The problem has been gradually worsening. The cough is non-productive. There has been no fever. She has tried nothing for the symptoms. The treatment provided no relief. She is not a smoker. Her past medical history does not include pneumonia.    Past Medical History:  Diagnosis Date  . Anemia   . Hypertension     There are no active problems to display for this patient.   Past Surgical History:  Procedure Laterality Date  . CESAREAN SECTION      OB History    No data available       Home Medications    Prior to Admission medications   Medication Sig Start Date End Date Taking? Authorizing Provider  amLODipine (NORVASC) 2.5 MG tablet Take 1 tablet (2.5 mg total) by mouth daily. 02/26/15 03/29/15  Alvira Monday, MD  HYDROcodone-acetaminophen (NORCO/VICODIN) 5-325 MG tablet Take 1-2 tablets by mouth every 6 (six) hours as needed. 05/01/16   Geoffery Lyons, MD  ibuprofen (ADVIL,MOTRIN) 800 MG tablet Take 1 tablet (800 mg total) by mouth 3 (three) times daily. 05/23/16   Elson Areas, PA-C  oseltamivir (TAMIFLU) 75 MG capsule Take 1 capsule (75 mg total) by mouth every 12 (twelve) hours. 05/23/16   Elson Areas, PA-C  penicillin v potassium (VEETID) 500 MG tablet Take 1 tablet (500 mg total) by mouth 3 (three) times daily. 05/01/16   Geoffery Lyons, MD    Family History History reviewed. No pertinent family history.  Social History Social History  Substance Use Topics  . Smoking status: Never Smoker  . Smokeless tobacco: Never Used  . Alcohol use No     Allergies   Patient has no known  allergies.   Review of Systems Review of Systems  Respiratory: Positive for cough.   All other systems reviewed and are negative.    Physical Exam Updated Vital Signs BP 121/74 (BP Location: Right Arm)   Pulse 89   Temp 99 F (37.2 C) (Oral)   Resp 18   Ht 5\' 3"  (1.6 m)   Wt 68 kg   LMP 05/14/2016   SpO2 100%   BMI 26.57 kg/m   Physical Exam  Constitutional: She appears well-developed and well-nourished. No distress.  HENT:  Head: Normocephalic and atraumatic.  Eyes: Conjunctivae are normal.  Neck: Neck supple.  Cardiovascular: Normal rate and regular rhythm.   No murmur heard. Pulmonary/Chest: Effort normal and breath sounds normal. No respiratory distress.  Abdominal: Soft. There is no tenderness.  Musculoskeletal: She exhibits no edema.  Neurological: She is alert.  Skin: Skin is warm and dry.  Psychiatric: She has a normal mood and affect.  Nursing note and vitals reviewed.    ED Treatments / Results  Labs (all labs ordered are listed, but only abnormal results are displayed) Labs Reviewed - No data to display  EKG  EKG Interpretation None       Radiology Dg Chest 2 View  Result Date: 05/23/2016 CLINICAL DATA:  31 year-old female c/o non-productive cough and fever since this am. EXAM: CHEST - 2 VIEW COMPARISON:  02/26/2015 FINDINGS: Lungs are clear. Heart size and mediastinal contours are within normal limits. No effusion. Visualized bones unremarkable. IMPRESSION: No acute cardiopulmonary disease. Electronically Signed   By: Corlis Leak  Hassell M.D.   On: 05/23/2016 13:23    Procedures Procedures (including critical care time)  Medications Ordered in ED Medications  ibuprofen (ADVIL,MOTRIN) tablet 800 mg (800 mg Oral Given 05/23/16 1328)     Initial Impression / Assessment and Plan / ED Course  I have reviewed the triage vital signs and the nursing notes.  Pertinent labs & imaging results that were available during my care of the patient were  reviewed by me and considered in my medical decision making (see chart for details).       Final Clinical Impressions(s) / ED Diagnoses   Final diagnoses:  Influenza-like illness    New Prescriptions Discharge Medication List as of 05/23/2016  1:27 PM    START taking these medications   Details  ibuprofen (ADVIL,MOTRIN) 800 MG tablet Take 1 tablet (800 mg total) by mouth 3 (three) times daily., Starting Sat 05/23/2016, Print    oseltamivir (TAMIFLU) 75 MG capsule Take 1 capsule (75 mg total) by mouth every 12 (twelve) hours., Starting Sat 05/23/2016, Print      An After Visit Summary was printed and given to the patient.   Lonia SkinnerLeslie K DelhiSofia, PA-C 05/24/16 1117    Pricilla LovelessScott Goldston, MD 05/27/16 [phone]

## 2016-07-13 ENCOUNTER — Other Ambulatory Visit (HOSPITAL_BASED_OUTPATIENT_CLINIC_OR_DEPARTMENT_OTHER): Payer: Self-pay

## 2016-07-13 DIAGNOSIS — R0683 Snoring: Secondary | ICD-10-CM

## 2016-09-06 ENCOUNTER — Ambulatory Visit (HOSPITAL_BASED_OUTPATIENT_CLINIC_OR_DEPARTMENT_OTHER): Payer: Medicaid Other | Attending: Physician Assistant

## 2017-08-22 ENCOUNTER — Emergency Department (HOSPITAL_BASED_OUTPATIENT_CLINIC_OR_DEPARTMENT_OTHER): Payer: Self-pay

## 2017-08-22 ENCOUNTER — Emergency Department (HOSPITAL_BASED_OUTPATIENT_CLINIC_OR_DEPARTMENT_OTHER)
Admission: EM | Admit: 2017-08-22 | Discharge: 2017-08-22 | Disposition: A | Discharge status: Home / Self Care | Payer: Self-pay | Attending: Emergency Medicine | Admitting: Emergency Medicine

## 2017-08-22 ENCOUNTER — Other Ambulatory Visit: Payer: Self-pay

## 2017-08-22 ENCOUNTER — Encounter (HOSPITAL_BASED_OUTPATIENT_CLINIC_OR_DEPARTMENT_OTHER): Payer: Self-pay | Admitting: Emergency Medicine

## 2017-08-22 DIAGNOSIS — R05 Cough: Secondary | ICD-10-CM | POA: Insufficient documentation

## 2017-08-22 DIAGNOSIS — I10 Essential (primary) hypertension: Secondary | ICD-10-CM | POA: Insufficient documentation

## 2017-08-22 DIAGNOSIS — R0789 Other chest pain: Secondary | ICD-10-CM | POA: Insufficient documentation

## 2017-08-22 DIAGNOSIS — Z79899 Other long term (current) drug therapy: Secondary | ICD-10-CM | POA: Insufficient documentation

## 2017-08-22 DIAGNOSIS — M79661 Pain in right lower leg: Secondary | ICD-10-CM | POA: Insufficient documentation

## 2017-08-22 DIAGNOSIS — R059 Cough, unspecified: Secondary | ICD-10-CM

## 2017-08-22 DIAGNOSIS — D649 Anemia, unspecified: Secondary | ICD-10-CM | POA: Insufficient documentation

## 2017-08-22 LAB — CBC WITH DIFFERENTIAL/PLATELET
BASOS ABS: 0 10*3/uL (ref 0.0–0.1)
Basophils Relative: 1 %
EOS ABS: 0.4 10*3/uL (ref 0.0–0.7)
EOS PCT: 10 %
HCT: 28.3 % — ABNORMAL LOW (ref 36.0–46.0)
Hemoglobin: 8.9 g/dL — ABNORMAL LOW (ref 12.0–15.0)
LYMPHS PCT: 44 %
Lymphs Abs: 1.9 10*3/uL (ref 0.7–4.0)
MCH: 25.5 pg — ABNORMAL LOW (ref 26.0–34.0)
MCHC: 31.4 g/dL (ref 30.0–36.0)
MCV: 81.1 fL (ref 78.0–100.0)
Monocytes Absolute: 0.4 10*3/uL (ref 0.1–1.0)
Monocytes Relative: 8 %
Neutro Abs: 1.6 10*3/uL — ABNORMAL LOW (ref 1.7–7.7)
Neutrophils Relative %: 37 %
PLATELETS: 337 10*3/uL (ref 150–400)
RBC: 3.49 MIL/uL — AB (ref 3.87–5.11)
RDW: 18.7 % — ABNORMAL HIGH (ref 11.5–15.5)
WBC: 4.3 10*3/uL (ref 4.0–10.5)

## 2017-08-22 LAB — BASIC METABOLIC PANEL
ANION GAP: 6 (ref 5–15)
BUN: 7 mg/dL (ref 6–20)
CO2: 25 mmol/L (ref 22–32)
Calcium: 9 mg/dL (ref 8.9–10.3)
Chloride: 108 mmol/L (ref 101–111)
Creatinine, Ser: 0.7 mg/dL (ref 0.44–1.00)
GFR calc Af Amer: >60 mL/min (ref >60)
Glucose, Bld: 86 mg/dL (ref 65–99)
POTASSIUM: 3.8 mmol/L (ref 3.5–5.1)
Sodium: 139 mmol/L (ref 135–145)

## 2017-08-22 LAB — D-DIMER, QUANTITATIVE: D-Dimer, Quant: 0.66 ug/mL-FEU — ABNORMAL HIGH (ref 0.00–0.50)

## 2017-08-22 LAB — TROPONIN I

## 2017-08-22 LAB — PREGNANCY, URINE: PREG TEST UR: NEGATIVE

## 2017-08-22 MED ORDER — BENZONATATE 100 MG PO CAPS: 100.0000 mg | ORAL_CAPSULE | Freq: Three times a day (TID) | ORAL | 0 refills | Status: DC

## 2017-08-22 MED ORDER — IOPAMIDOL (ISOVUE-370) INJECTION 76%
100.0000 mL | Freq: Once | INTRAVENOUS | Status: AC | PRN
  Administered 2017-08-22: 61 mL via INTRAVENOUS

## 2017-08-22 MED ORDER — ALBUTEROL SULFATE HFA 108 (90 BASE) MCG/ACT IN AERS: 1.0000 | INHALATION_SPRAY | Freq: Four times a day (QID) | RESPIRATORY_TRACT | 0 refills | Status: DC | PRN

## 2017-08-22 MED ORDER — IPRATROPIUM-ALBUTEROL 0.5-2.5 (3) MG/3ML IN SOLN
3.0000 mL | Freq: Four times a day (QID) | RESPIRATORY_TRACT | Status: DC
  Administered 2017-08-22: 3 mL via RESPIRATORY_TRACT
  Filled 2017-08-22: qty 3

## 2017-08-22 MED ORDER — ALBUTEROL SULFATE HFA 108 (90 BASE) MCG/ACT IN AERS
1.0000 | INHALATION_SPRAY | Freq: Once | RESPIRATORY_TRACT | Status: AC
  Administered 2017-08-22: 1 via RESPIRATORY_TRACT
  Filled 2017-08-22: qty 6.7

## 2017-08-22 NOTE — ED Notes (Signed)
Patient transported to Ultrasound 

## 2017-08-22 NOTE — ED Provider Notes (Signed)
[facility] EMERGENCY DEPARTMENT Provider Note   CSN: [id number] Arrival date & time: 08/22/17  1257     History   Chief Complaint Chief Complaint  Patient presents with  . Cough    HPI Anna Brock is a 32 y.o. female.  HPI   32 year old female with a history of anemia and hypertension who presents the emergency department today to be evaluated for a cough and shortness of breath.  She states that she has had a cough that began 9 months ago when she was pregnant. States cough has worsened over the last 3 months.  Cough is productive with yellow/green sputum.  Has an albuterol inhaler at home which she has used in the past.  It has provided mild relief but has not resolved her symptoms. She states that she also has some midsternal and right sided chest pain that occurs constantly but is worse after she coughs. She also states that she has been short of breath especially with her normal daily activities.  States that when her cough initially began she also had right lower extremity swelling and pain.  States the this has resolved but she still is having some pain in her right calf.  Also complains of rhinorrhea, congestion, bilateral ear pain, no sore throat or fevers.  No abdominal pain, nausea, vomiting, diarrhea.  Denies any hemoptysis.  Denies any recent surgeries, extended periods of travel, periods of immobility.  She is not on birth control.  Past Medical History:  Diagnosis Date  . Anemia   . Hypertension     There are no active problems to display for this patient.   Past Surgical History:  Procedure Laterality Date  . CESAREAN SECTION       OB History   None      Home Medications    Prior to Admission medications   Medication Sig Start Date End Date Taking? Authorizing Provider  albuterol (PROVENTIL HFA;VENTOLIN HFA) 108 (90 Base) MCG/ACT inhaler Inhale 1-2 puffs into the lungs every 6 (six) hours as needed for wheezing or shortness of breath.  08/22/17   Sebrina Kessner S, PA-C  amLODipine (NORVASC) 2.5 MG tablet Take 1 tablet (2.5 mg total) by mouth daily. 02/26/15 03/29/15  Alvira Monday, MD  benzonatate (TESSALON) 100 MG capsule Take 1 capsule (100 mg total) by mouth every 8 (eight) hours. 08/22/17   Harlis Champoux S, PA-C  HYDROcodone-acetaminophen (NORCO/VICODIN) 5-325 MG tablet Take 1-2 tablets by mouth every 6 (six) hours as needed. 05/01/16   Geoffery Lyons, MD  ibuprofen (ADVIL,MOTRIN) 800 MG tablet Take 1 tablet (800 mg total) by mouth 3 (three) times daily. 05/23/16   Elson Areas, PA-C  oseltamivir (TAMIFLU) 75 MG capsule Take 1 capsule (75 mg total) by mouth every 12 (twelve) hours. 05/23/16   Elson Areas, PA-C  penicillin v potassium (VEETID) 500 MG tablet Take 1 tablet (500 mg total) by mouth 3 (three) times daily. 05/01/16   Geoffery Lyons, MD    Family History History reviewed. No pertinent family history.  Social History Social History   Tobacco Use  . Smoking status: Never Smoker  . Smokeless tobacco: Never Used  Substance Use Topics  . Alcohol use: No  . Drug use: No     Allergies   Patient has no known allergies.   Review of Systems Review of Systems  Constitutional: Negative for chills and fever.  HENT: Positive for congestion and rhinorrhea. Negative for ear pain and sore throat.   Eyes:  Negative for pain and visual disturbance.  Respiratory: Positive for cough and shortness of breath.   Cardiovascular: Positive for chest pain and leg swelling (resolved). Negative for palpitations.  Gastrointestinal: Negative for abdominal pain, constipation, diarrhea, nausea and vomiting.  Genitourinary: Negative for dysuria, flank pain and hematuria.  Musculoskeletal: Negative for back pain and neck pain.  Skin: Negative for rash.  Neurological: Negative for dizziness, weakness, light-headedness, numbness and headaches.  All other systems reviewed and are negative.  Physical Exam Updated Vital Signs BP  131/85   Pulse 67   Temp 98.7 F (37.1 C) (Oral)   Resp 18   Ht 5\' 3"  (1.6 m)   Wt 77.1 kg (170 lb)   LMP 07/29/2017   SpO2 100%   BMI 30.11 kg/m   Physical Exam  Constitutional: She is oriented to person, place, and time. She appears well-developed and well-nourished. No distress.  HENT:  Head: Normocephalic and atraumatic.  Right Ear: External ear normal.  Left Ear: External ear normal.  Nose: Nose normal.  Mouth/Throat: Oropharynx is clear and moist.  No pharyngeal erythema, no tonsillar swelling or exudates  Eyes: Pupils are equal, round, and reactive to light. Conjunctivae and EOM are normal.  Neck: Neck supple.  Cardiovascular: Normal rate, regular rhythm, normal heart sounds and intact distal pulses.  No murmur heard. Pulmonary/Chest: Effort normal and breath sounds normal. No stridor. No respiratory distress. She has no wheezes. She has no rales.  Abdominal: Soft. Bowel sounds are normal. She exhibits no distension. There is no tenderness. There is no guarding.  Musculoskeletal:  No calf TTP, erythema, swelling.  Neurological: She is alert and oriented to person, place, and time.  Skin: Skin is warm and dry. Capillary refill takes less than 2 seconds.  Psychiatric: She has a normal mood and affect.  Nursing note and vitals reviewed.  ED Treatments / Results  Labs (all labs ordered are listed, but only abnormal results are displayed) Labs Reviewed  CBC WITH DIFFERENTIAL/PLATELET - Abnormal; Notable for the following components:      Result Value   RBC 3.49 (*)    Hemoglobin 8.9 (*)    HCT 28.3 (*)    MCH 25.5 (*)    RDW 18.7 (*)    Neutro Abs 1.6 (*)    All other components within normal limits  D-DIMER, QUANTITATIVE (NOT AT [facility]) - Abnormal; Notable for the following components:   D-Dimer, Quant 0.66 (*)    All other components within normal limits  BASIC METABOLIC PANEL  TROPONIN I  PREGNANCY, URINE    EKG EKG Interpretation  Date/Time:  Sunday August 22 2017 15:13:47 EDT Ventricular Rate:  77 PR Interval:    QRS Duration: 96 QT Interval:  380 QTC Calculation: 430 R Axis:   77 Text Interpretation:  Sinus rhythm No significant change since last tracing Confirmed by Linwood DibblesKnapp, Jon [phone]([id number]) on 08/22/2017 3:17:05 PM   Radiology Dg Chest 2 View  Result Date: 08/22/2017 CLINICAL DATA:  Cough EXAM: CHEST - 2 VIEW COMPARISON:  05/23/2016 chest radiograph. FINDINGS: Stable cardiomediastinal silhouette with normal heart size. No pneumothorax. No pleural effusion. Lungs appear clear, with no acute consolidative airspace disease and no pulmonary edema. IMPRESSION: No active cardiopulmonary disease. Electronically Signed   By: Delbert PhenixJason A Poff M.D.   On: 08/22/2017 13:39   Ct Angio Chest Pe W And/or Wo Contrast  Result Date: 08/22/2017 CLINICAL DATA:  Chest pain EXAM: CT ANGIOGRAPHY CHEST WITH CONTRAST TECHNIQUE: Multidetector CT imaging of the chest  was performed using the standard protocol during bolus administration of intravenous contrast. Multiplanar CT image reconstructions and MIPs were obtained to evaluate the vascular anatomy. CONTRAST:  61mL ISOVUE-370 IOPAMIDOL (ISOVUE-370) INJECTION 76% COMPARISON:  Chest radiograph August 22, 2017 FINDINGS: Cardiovascular: There is no demonstrable pulmonary embolus. There is no thoracic aortic aneurysm or dissection. Visualized great vessels appear normal. There is no pericardial effusion or pericardial thickening. Mediastinum/Nodes: Thyroid appears normal. There is mild residual thymic tissue, normal for age. There is no appreciable thoracic adenopathy. No esophageal lesions are evident. Lungs/Pleura: There is no appreciable edema or consolidation. There is slight atelectatic change in the left lower lobe. No pleural effusion or pleural thickening evident. Upper Abdomen: Visualized upper abdominal structures appear unremarkable. Musculoskeletal: There are no blastic or lytic bone lesions. No chest wall lesions are evident.  Review of the MIP images confirms the above findings. IMPRESSION: 1. No demonstrable pulmonary embolus. No thoracic aortic aneurysm or dissection. 2.  Slight left base atelectasis.  No lung edema or consolidation. 3.  No evident thoracic adenopathy. Electronically Signed   By: Bretta Bang III M.D.   On: 08/22/2017 15:49   US Venous Img Lower Right (dvt Study)  Result Date: 08/22/2017 CLINICAL DATA:  Right leg pain and swelling. Postop from cesarean section delivery 4 months ago. EXAM: RIGHT LOWER EXTREMITY VENOUS DOPPLER ULTRASOUND TECHNIQUE: Gray-scale sonography with graded compression, as well as color Doppler and duplex ultrasound were performed to evaluate the lower extremity deep venous systems from the level of the common femoral vein and including the common femoral, femoral, profunda femoral, popliteal and calf veins including the posterior tibial, peroneal and gastrocnemius veins when visible. The superficial great saphenous vein was also interrogated. Spectral Doppler was utilized to evaluate flow at rest and with distal augmentation maneuvers in the common femoral, femoral and popliteal veins. COMPARISON:  None. FINDINGS: Contralateral Common Femoral Vein: Respiratory phasicity is normal and symmetric with the symptomatic side. No evidence of thrombus. Normal compressibility. Common Femoral Vein: No evidence of thrombus. Normal compressibility, respiratory phasicity and response to augmentation. Saphenofemoral Junction: No evidence of thrombus. Normal compressibility and flow on color Doppler imaging. Profunda Femoral Vein: No evidence of thrombus. Normal compressibility and flow on color Doppler imaging. Femoral Vein: No evidence of thrombus. Normal compressibility, respiratory phasicity and response to augmentation. Popliteal Vein: No evidence of thrombus. Normal compressibility, respiratory phasicity and response to augmentation. Calf Veins: No evidence of thrombus. Normal compressibility and  flow on color Doppler imaging. Superficial Great Saphenous Vein: No evidence of thrombus. Normal compressibility. Venous Reflux:  None. Other Findings:  None. IMPRESSION: No evidence of deep venous thrombosis. Electronically Signed   By: Myles Rosenthal M.D.   On: 08/22/2017 15:11    Procedures Procedures (including critical care time)  Medications Ordered in ED Medications  ipratropium-albuterol (DUONEB) 0.5-2.5 (3) MG/3ML nebulizer solution 3 mL (3 mLs Nebulization Given 08/22/17 1507)  iopamidol (ISOVUE-370) 76 % injection 100 mL (61 mLs Intravenous Contrast Given 08/22/17 1529)  albuterol (PROVENTIL HFA;VENTOLIN HFA) 108 (90 Base) MCG/ACT inhaler 1 puff (1 puff Inhalation Given 08/22/17 1702)    Initial Impression / Assessment and Plan / ED Course  I have reviewed the triage vital signs and the nursing notes.  Pertinent labs & imaging results that were available during my care of the patient were reviewed by me and considered in my medical decision making (see chart for details).  Final Clinical Impressions(s) / ED Diagnoses   Final diagnoses:  Cough  Atypical chest  pain  Anemia, unspecified type   Pt presenting with cough, sob with exertion, and chest pain that is constant but worse with coughing. Has had cough for 9 months and started during pregnancy.  States RLE was swollen and painful when coughing began when she was pregnant. Still has some pain to the leg. sxs improved with albuterol at home, but pt ran out. VSS. No tachycardia. Normal VS. satting at 97% on RA.   Given patient's report that she had cough, shortness of breath, and leg swelling that began at the same time there was concern for PE and/or DVT.  Right lower extremity ultrasound was negative for DVT.  D-dimer was ordered and was positive.  CTA of the chest was negative for PE.  Chest x-ray was also negative for pneumonia, pneumothorax, pneumomediastinum or cardiomegaly.  No pulmonary edema noted.  Patient has some anemia on her  CBC.  It is consistent with her baseline.  States she is taking iron supplementation at home.  No leukocytosis.  Normal kidney function and electrolytes.  Troponin was negative.  Pregnancy test negative.  ECG without ischemic changes.  Normal sinus rhythm.  Doubt ACS.  AAA not suspected.  Denies suspect any other emergent/urgent cardiopulmonary etiology at this time.  Patient symptoms feel improved after nebulizer treatment.  Will send her home with an albuterol inhaler and cough medication.  Advised her to follow-up with her PCP for reevaluation.  She has an appointment next week.  Discussed strict return precautions for any new or worsening symptoms.  All questions were answered patient understands the plan agrees to return immediately to the ED.  ED Discharge Orders        Ordered    albuterol (PROVENTIL HFA;VENTOLIN HFA) 108 (90 Base) MCG/ACT inhaler  Every 6 hours PRN     08/22/17 1655    benzonatate (TESSALON) 100 MG capsule  Every 8 hours     08/22/17 1655       Husein Guedes S, PA-C 08/22/17 1731    Linwood Dibbles, MD 08/23/17 [phone]

## 2017-08-22 NOTE — ED Triage Notes (Signed)
Patient states that she has a cough x 5 months. She recently became concerned because when she coughs she has sharp pains to her right chest.

## 2017-08-22 NOTE — ED Notes (Signed)
Pt reports pain began this morning, pt also reports some fevers but she has been taking ibuprofen/tylenol

## 2017-08-22 NOTE — Discharge Instructions (Addendum)
You were given a prescription for an albuterol inhaler and cough medication which you may take for your symptoms.  You should follow-up with your primary care doctor as scheduled next week.  Please return to the ER if you have any new or worsening symptoms in the meantime.

## 2017-08-22 NOTE — ED Notes (Signed)
Patient transported to X-ray 

## 2017-08-22 NOTE — ED Notes (Signed)
Pt attempting urine sample 

## 2017-08-22 NOTE — ED Notes (Signed)
MDI and spacer given to patient in to take home. Instructed spacer. No further questions

## 2017-08-22 NOTE — ED Notes (Signed)
ED Provider at bedside. 

## 2017-12-11 ENCOUNTER — Emergency Department (HOSPITAL_BASED_OUTPATIENT_CLINIC_OR_DEPARTMENT_OTHER): Payer: Medicaid Other

## 2017-12-11 ENCOUNTER — Emergency Department (HOSPITAL_BASED_OUTPATIENT_CLINIC_OR_DEPARTMENT_OTHER)
Admission: EM | Admit: 2017-12-11 | Discharge: 2017-12-11 | Disposition: A | Discharge status: Home / Self Care | Payer: Medicaid Other | Attending: Emergency Medicine | Admitting: Emergency Medicine

## 2017-12-11 ENCOUNTER — Encounter (HOSPITAL_BASED_OUTPATIENT_CLINIC_OR_DEPARTMENT_OTHER): Payer: Self-pay | Admitting: Emergency Medicine

## 2017-12-11 ENCOUNTER — Other Ambulatory Visit: Payer: Self-pay

## 2017-12-11 DIAGNOSIS — Z79899 Other long term (current) drug therapy: Secondary | ICD-10-CM | POA: Insufficient documentation

## 2017-12-11 DIAGNOSIS — J069 Acute upper respiratory infection, unspecified: Secondary | ICD-10-CM | POA: Diagnosis not present

## 2017-12-11 DIAGNOSIS — R05 Cough: Secondary | ICD-10-CM | POA: Diagnosis present

## 2017-12-11 DIAGNOSIS — B9789 Other viral agents as the cause of diseases classified elsewhere: Secondary | ICD-10-CM | POA: Diagnosis not present

## 2017-12-11 DIAGNOSIS — I10 Essential (primary) hypertension: Secondary | ICD-10-CM | POA: Diagnosis not present

## 2017-12-11 MED ORDER — IBUPROFEN 800 MG PO TABS
800.0000 mg | ORAL_TABLET | Freq: Once | ORAL | Status: AC
  Administered 2017-12-11: 800 mg via ORAL
  Filled 2017-12-11: qty 1

## 2017-12-11 NOTE — ED Provider Notes (Signed)
[facility] EMERGENCY DEPARTMENT Provider Note   CSN: [id number] Arrival date & time: 12/11/17  1132     History   Chief Complaint Chief Complaint  Patient presents with  . Flu like symptoms    HPI Anna Brock is a 32 y.o. female.  The history is provided by the patient. No language interpreter was used.   Anna Brock is a 32 y.o. female who presents to the Emergency Department complaining of flu like symptoms. She presents to the emergency department for three days of flulike symptoms. She reports fevers with generalized body aches, nasal congestion, and cough. She has had sick contacts with similar symptoms. She denies any difficulty breathing, shortness of breath, nausea, vomiting, abdominal pain, dysuria. She has no medical problems and takes no medications. She is eight months postpartum following a cesarean delivery. No history of blood clots. She has previously been on a blood pressure medication, but with this was discontinued when her blood pressure returned to normal. Past Medical History:  Diagnosis Date  . Anemia   . Hypertension     There are no active problems to display for this patient.   Past Surgical History:  Procedure Laterality Date  . CESAREAN SECTION       OB History   None      Home Medications    Prior to Admission medications   Medication Sig Start Date End Date Taking? Authorizing Provider  albuterol (PROVENTIL HFA;VENTOLIN HFA) 108 (90 Base) MCG/ACT inhaler Inhale 1-2 puffs into the lungs every 6 (six) hours as needed for wheezing or shortness of breath. 08/22/17   Couture, Cortni S, PA-C  amLODipine (NORVASC) 2.5 MG tablet Take 1 tablet (2.5 mg total) by mouth daily. 02/26/15 03/29/15  Alvira Monday, MD  benzonatate (TESSALON) 100 MG capsule Take 1 capsule (100 mg total) by mouth every 8 (eight) hours. 08/22/17   Couture, Cortni S, PA-C  HYDROcodone-acetaminophen (NORCO/VICODIN) 5-325 MG tablet Take 1-2 tablets by mouth  every 6 (six) hours as needed. 05/01/16   Geoffery Lyons, MD  ibuprofen (ADVIL,MOTRIN) 800 MG tablet Take 1 tablet (800 mg total) by mouth 3 (three) times daily. 05/23/16   Elson Areas, PA-C  oseltamivir (TAMIFLU) 75 MG capsule Take 1 capsule (75 mg total) by mouth every 12 (twelve) hours. 05/23/16   Elson Areas, PA-C  penicillin v potassium (VEETID) 500 MG tablet Take 1 tablet (500 mg total) by mouth 3 (three) times daily. 05/01/16   Geoffery Lyons, MD    Family History No family history on file.  Social History Social History   Tobacco Use  . Smoking status: Never Smoker  . Smokeless tobacco: Never Used  Substance Use Topics  . Alcohol use: No  . Drug use: No     Allergies   Patient has no known allergies.   Review of Systems Review of Systems  All other systems reviewed and are negative.    Physical Exam Updated Vital Signs BP 138/61 (BP Location: Right Arm)   Pulse 89   Temp (!) 100.5 F (38.1 C) (Oral)   Resp 20   Ht 5\' 3"  (1.6 m)   Wt 74.8 kg   LMP 11/28/2017   SpO2 100%   BMI 29.23 kg/m   Physical Exam  Constitutional: She is oriented to person, place, and time. She appears well-developed and well-nourished.  HENT:  Head: Normocephalic and atraumatic.  Right TM obscured by cerumen. Left TM without erythema.  Eyes: Pupils are equal, round, and reactive  to light. EOM are normal.  Neck: Neck supple.  Cardiovascular: Regular rhythm.  No murmur heard. Tachycardic  Pulmonary/Chest: Effort normal and breath sounds normal. No respiratory distress.  Abdominal: Soft. There is no tenderness. There is no rebound and no guarding.  Musculoskeletal: She exhibits no edema or tenderness.  Neurological: She is alert and oriented to person, place, and time.  Skin: Skin is warm and dry.  Psychiatric: She has a normal mood and affect. Her behavior is normal.  Nursing note and vitals reviewed.    ED Treatments / Results  Labs (all labs ordered are listed, but  only abnormal results are displayed) Labs Reviewed - No data to display  EKG None  Radiology Dg Chest 2 View  Result Date: 12/11/2017 CLINICAL DATA:  Cough and fever EXAM: CHEST - 2 VIEW COMPARISON:  August 22, 2017 chest radiograph and chest CT FINDINGS: Lungs are clear. The heart size and pulmonary vascularity are normal. No adenopathy. No evident bone lesions. IMPRESSION: No edema or consolidation. Electronically Signed   By: Bretta Bang III M.D.   On: 12/11/2017 13:28    Procedures Procedures (including critical care time)  Medications Ordered in ED Medications  ibuprofen (ADVIL,MOTRIN) tablet 800 mg (800 mg Oral Given 12/11/17 1202)     Initial Impression / Assessment and Plan / ED Course  I have reviewed the triage vital signs and the nursing notes.  Pertinent labs & imaging results that were available during my care of the patient were reviewed by me and considered in my medical decision making (see chart for details).    Patient here for evaluation of fever, body aches, cough. She is non-toxic appearing on examination with no respiratory distress. There is no evidence of pneumonia based on examination or imaging. She was febrile and tachycardic on ED arrival, improved after ibuprofen administration. Discussed with patient home care for viral URI with oral fluid hydration, Tylenol or ibuprofen for body aches and fever. Discussed outpatient follow-up as well as return precautions.  Final Clinical Impressions(s) / ED Diagnoses   Final diagnoses:  Viral URI with cough    ED Discharge Orders    None       Tilden Fossa, MD 12/11/17 1609

## 2017-12-11 NOTE — ED Triage Notes (Signed)
Cough, fever, and body aches x 2 days. Took nyquil at 8 this am.

## 2018-11-05 IMAGING — US US EXTREM LOW VENOUS*R*
1 series · 13 of 24 positions shown · non-contrast
Comparison: None.

CLINICAL DATA: Right leg pain and swelling. Postop from cesarean
section delivery 4 months ago.



[Series 1: us extrem low venous*right* · 0.05mm/px · 13 of 31 slices shown]
[im 1/31]
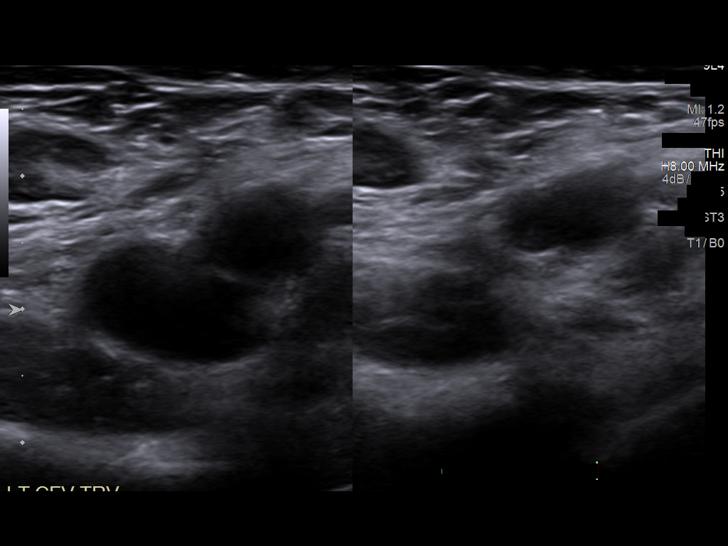
[im 3/31]
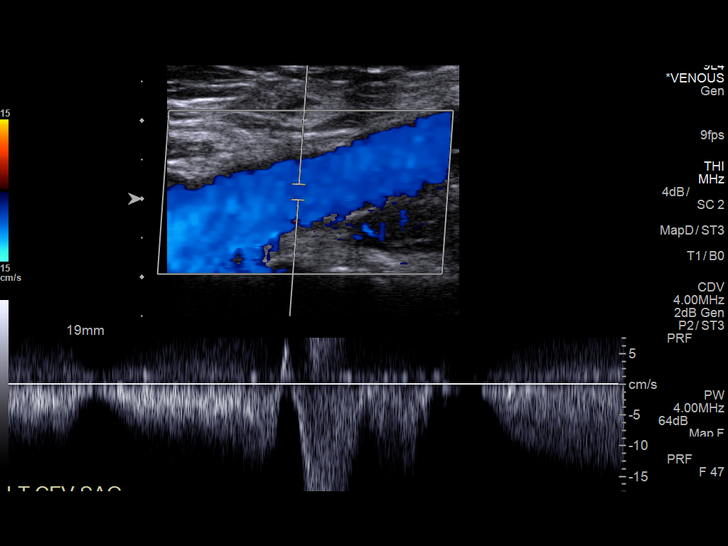
[im 6/31]
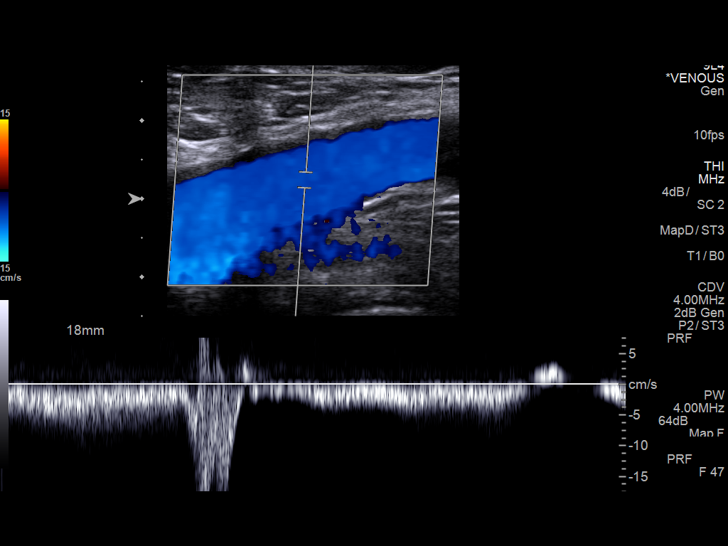
[im 8/31]
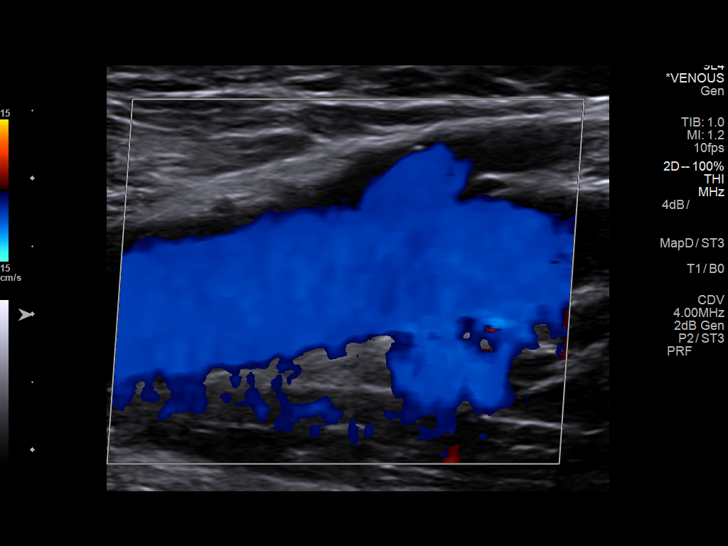
[im 11/31]
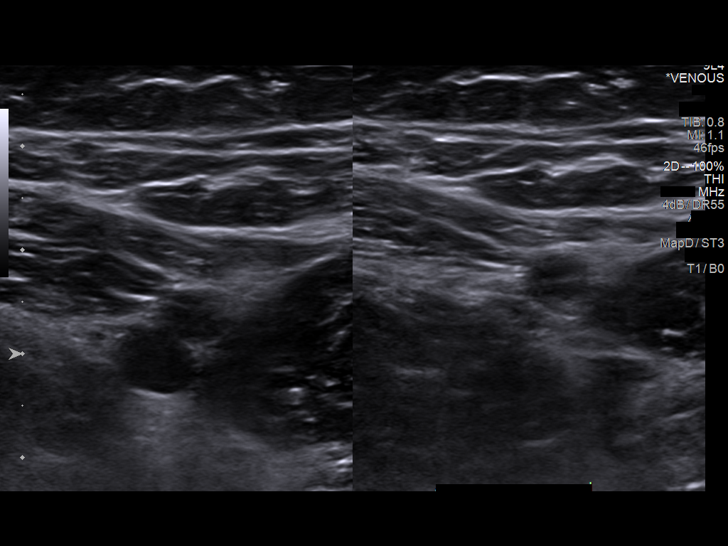
[im 14/31]
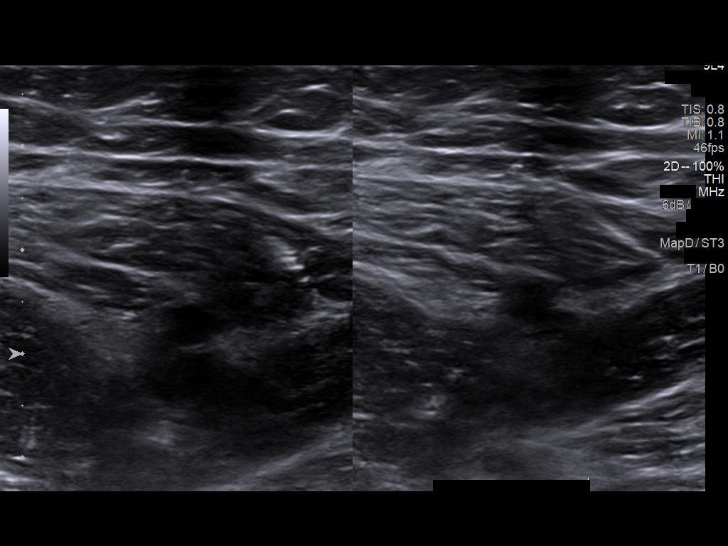
[im 16/31]
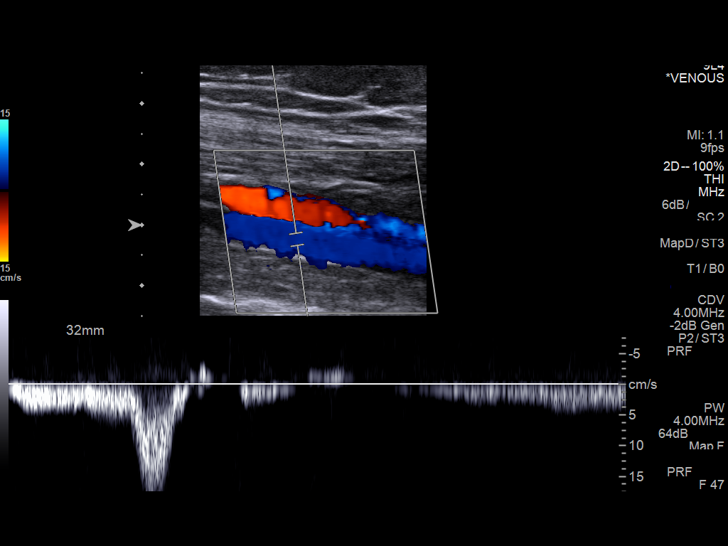
[im 17/31]
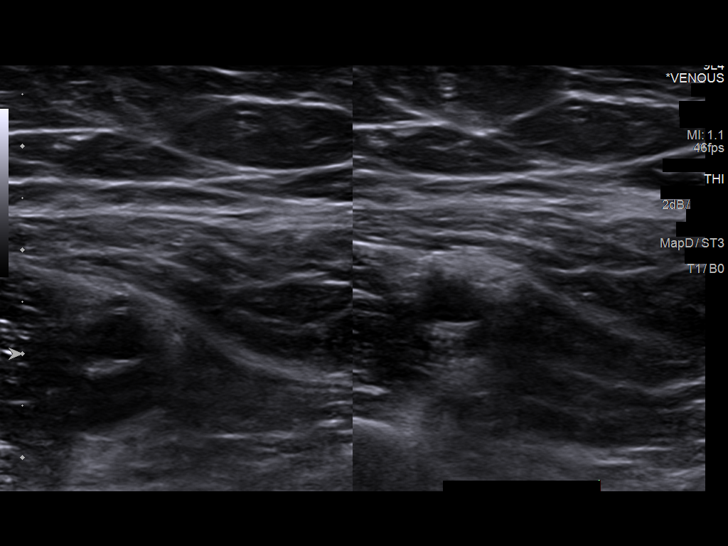
[im 20/31]
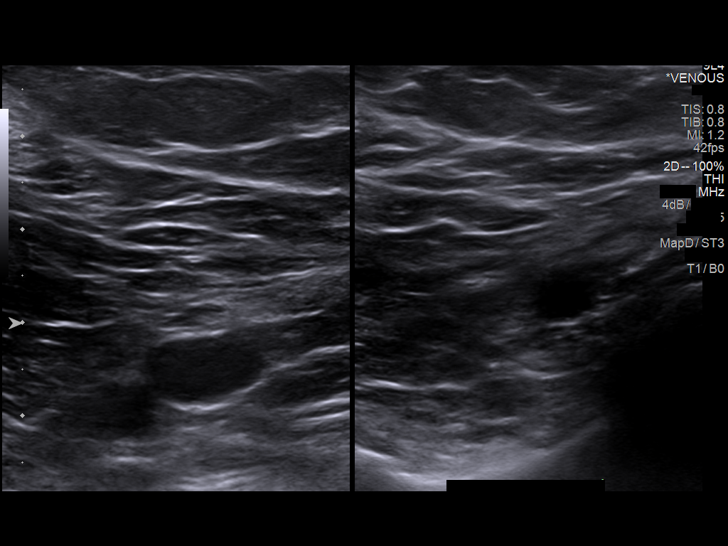
[im 23/31]
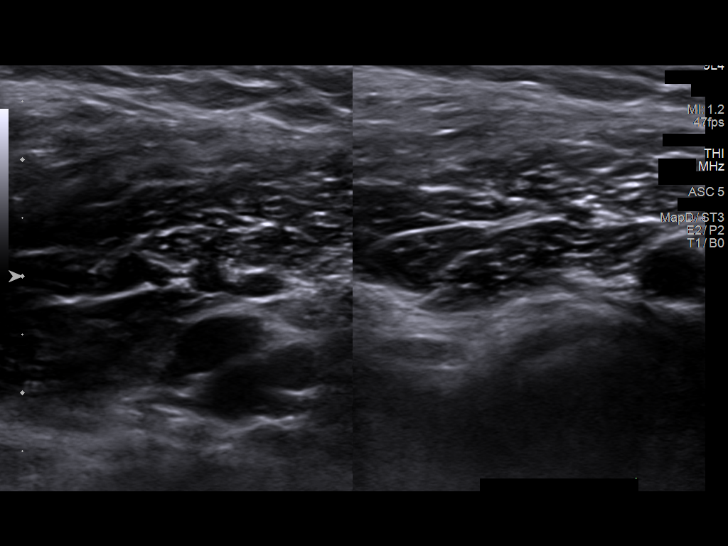
[im 25/31]
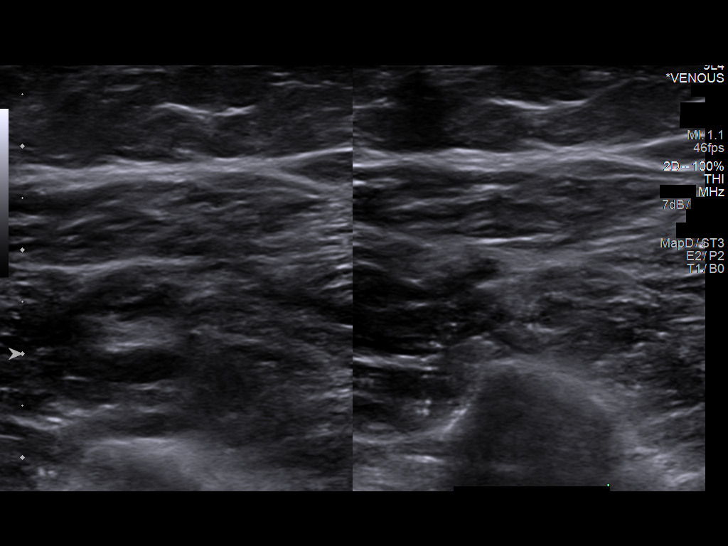
[im 28/31]
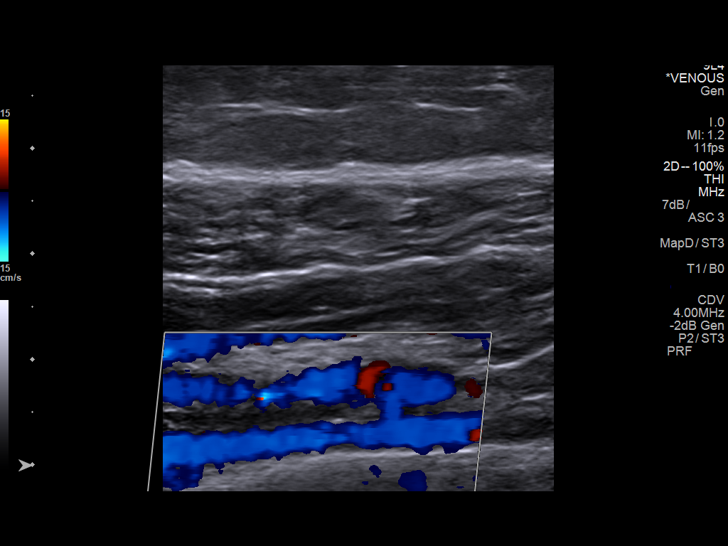
[im 31/31]
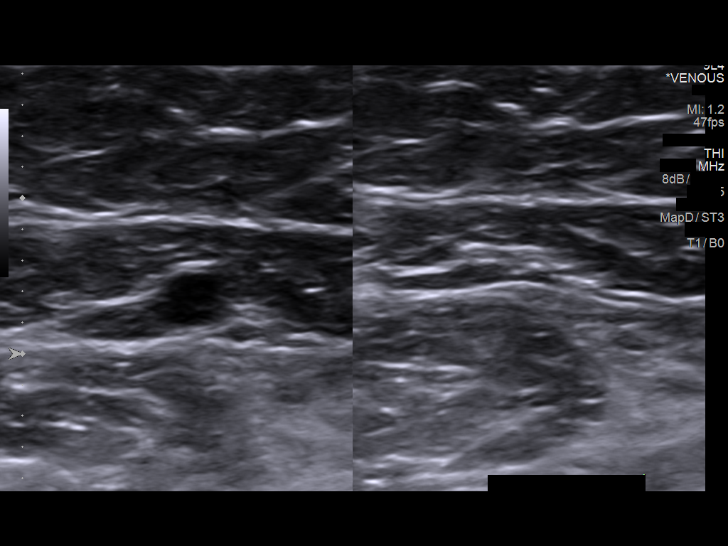

[13 of 24 positions shown; findings below may reference images not displayed]

FINDINGS: Contralateral Common Femoral Vein: Respiratory phasicity is normal
and symmetric with the symptomatic side. No evidence of thrombus.
Normal compressibility.

Common Femoral Vein: No evidence of thrombus. Normal
compressibility, respiratory phasicity and response to augmentation.

Saphenofemoral Junction: No evidence of thrombus. Normal
compressibility and flow on color Doppler imaging.

Profunda Femoral Vein: No evidence of thrombus. Normal
compressibility and flow on color Doppler imaging.

Femoral Vein: No evidence of thrombus. Normal compressibility,
respiratory phasicity and response to augmentation.

Popliteal Vein: No evidence of thrombus. Normal compressibility,
respiratory phasicity and response to augmentation.

Calf Veins: No evidence of thrombus. Normal compressibility and flow
on color Doppler imaging.

Superficial Great Saphenous Vein: No evidence of thrombus. Normal
compressibility.

Venous Reflux:  None.

Other Findings:  None.
IMPRESSION: No evidence of deep venous thrombosis.

## 2019-05-10 ENCOUNTER — Ambulatory Visit (INDEPENDENT_AMBULATORY_CARE_PROVIDER_SITE_OTHER): Payer: Medicaid Other | Admitting: Allergy and Immunology

## 2019-05-10 ENCOUNTER — Encounter: Payer: Self-pay | Admitting: Allergy and Immunology

## 2019-05-10 ENCOUNTER — Other Ambulatory Visit: Payer: Self-pay

## 2019-05-10 VITALS — BP 110/78 | HR 97 | Temp 97.9°F | Resp 16 | Ht 64.5 in | Wt 188.0 lb

## 2019-05-10 DIAGNOSIS — J320 Chronic maxillary sinusitis: Secondary | ICD-10-CM | POA: Diagnosis not present

## 2019-05-10 DIAGNOSIS — J4520 Mild intermittent asthma, uncomplicated: Secondary | ICD-10-CM | POA: Diagnosis not present

## 2019-05-10 DIAGNOSIS — J329 Chronic sinusitis, unspecified: Secondary | ICD-10-CM | POA: Insufficient documentation

## 2019-05-10 DIAGNOSIS — H1013 Acute atopic conjunctivitis, bilateral: Secondary | ICD-10-CM | POA: Diagnosis not present

## 2019-05-10 DIAGNOSIS — T781XXD Other adverse food reactions, not elsewhere classified, subsequent encounter: Secondary | ICD-10-CM

## 2019-05-10 DIAGNOSIS — J3089 Other allergic rhinitis: Secondary | ICD-10-CM | POA: Insufficient documentation

## 2019-05-10 DIAGNOSIS — T781XXA Other adverse food reactions, not elsewhere classified, initial encounter: Secondary | ICD-10-CM | POA: Insufficient documentation

## 2019-05-10 DIAGNOSIS — H1010 Acute atopic conjunctivitis, unspecified eye: Secondary | ICD-10-CM | POA: Insufficient documentation

## 2019-05-10 HISTORY — DX: Mild intermittent asthma, uncomplicated: J45.20

## 2019-05-10 MED ORDER — EPINEPHRINE 0.3 MG/0.3ML IJ SOAJ: 0.3000 mg | INTRAMUSCULAR | 1 refills | Status: DC | PRN

## 2019-05-10 MED ORDER — LEVOCETIRIZINE DIHYDROCHLORIDE 5 MG PO TABS: 5.0000 mg | ORAL_TABLET | Freq: Every day | ORAL | 5 refills | Status: DC | PRN

## 2019-05-10 MED ORDER — AZELASTINE-FLUTICASONE 137-50 MCG/ACT NA SUSP: 1.0000 | Freq: Two times a day (BID) | NASAL | 5 refills | Status: DC | PRN

## 2019-05-10 MED ORDER — OLOPATADINE HCL 0.2 % OP SOLN: 1.0000 [drp] | Freq: Every day | OPHTHALMIC | 5 refills | Status: DC | PRN

## 2019-05-10 NOTE — Progress Notes (Signed)
New Patient Note  RE: Anna Brock MRN: [id number] DOB: [birth year] Date of Office Visit: 05/10/2019  Referring provider: Norm Salt, PA Primary care provider: Norm Salt, PA  Chief Complaint: Sinus Problem, Nasal Congestion, Wheezing, and Food Intolerance   History of present illness: Anna Brock is a 34 y.o. female seen today in consultation requested by Norva Riffle, PA.  She complains of nasal congestion, rhinorrhea, sneezing, postnasal drainage, nasal pruritus, ocular pruritus, and sinus pressure over the forehead, between the eyes, and over the cheekbones.  These symptoms have progressed over the past 2 years.  At times, her nasal congestion is so severe that she requires mouth breathing and loses her sense of smell.  Exposure to dust causes "sneezing constantly."  Otherwise, no significant seasonal symptom variation has been noted nor have specific environmental triggers been identified.  She has attempted to control the symptoms with nonsedating antihistamines daily.  She was recently prescribed montelukast, however stated she did not want to start this medication until she had this appointment today.  Last week she was treated for a sinus infection with a course of prednisone and a course of antibiotics.  She reports that she experienced significant relief from the prednisone and antibiotics, however her symptoms "returned as soon as (she) stopped taking it." Anna Brock experiences oral pruritus with the consumption of multiple raw fruits.  She has actually stopped eating many foods, including shellfish, because of the concern of food allergies due to itchy mouth with the consumption of fruits.  She does not experience concomitant urticaria, angioedema, cardiopulmonary symptoms, or other GI symptoms. Anna Brock reports that she required albuterol rescue "every day" while pregnant 2 years ago.  Since pregnancy, she has rarely required albuterol rescue, typically when  her nasal allergy symptoms flare, and she denies limitations in normal daily activities or nocturnal awakenings due to lower respiratory symptoms.  Assessment and plan: Perennial and seasonal allergic rhinitis  Aeroallergen avoidance measures have been discussed and provided in written form.  A prescription has been provided for levocetirizine(Xyzal), 5 mg daily as needed.  To avoid diminishing benefit with daily use (tachyphylaxis) of second generation antihistamine, consider alternating every few months between fexofenadine (Allegra) and levocetirizine (Xyzal).  Start montelukast 10 mg daily at bedtime.  Potential side effects have been discussed.  A prescription has been provided for azelastine/fluticasone nasal spray, 1 spray per nostril twice daily as needed. Proper nasal spray technique has been discussed and demonstrated.  Nasal saline lavage (NeilMed) has been recommended as needed and prior to medicated nasal sprays along with instructions for proper administration.  The patient is an excellent candidate for aeroallergen immunotherapy and is considering this possibility.  Allergic conjunctivitis  Treatment plan as outlined above for allergic rhinitis.  A prescription has been provided for Pataday, one drop per eye daily as needed.  I have also recommended eye lubricant drops (i.e., Natural Tears) as needed.  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 Peeling or cooking the food has shown to reduce symptoms and antihistamines may also relieve symptoms. Immunotherapy to the cross reacting pollens has improved or cured OAS in many patients, though this has not been consistent for all patients. Typically OAS is limited to itching or swelling of mucosal tissues from the lips to the back of the throat.   Information about OAS has been discussed and provided in written form.  All foods causing symptoms are to  be avoided.  Should symptoms progress beyond the mouth and throat, epinephrine is to be administered and 911 is to be called immediately.  A prescription has been provided for epinephrine 0.3 mg autoinjector (EpiPen) 2 pack along with instructions for its proper administration.  Chronic sinusitis  Treatment plan as outlined above for allergic rhinitis.  For thick post nasal drainage, nasal congestion, and/or sinus pressure, add guaifenesin 1200 mg (Mucinex Maximum Strength) plus/minus pseudoephedrine 120 mg  twice daily as needed with adequate hydration as discussed. Pseudoephedrine is only to be used for short-term relief of nasal/sinus congestion. Long-term use is discouraged due to potential side effects.  Mild intermittent asthma  Continue to have access to albuterol HFA, 1 to 2 inhalations every 4-6 hours if needed.  Subjective and objective measures of pulmonary function will be followed and the treatment plan will be adjusted accordingly.   Meds ordered this encounter  Medications  . levocetirizine (XYZAL) 5 MG tablet    Sig: Take 1 tablet (5 mg total) by mouth daily as needed for allergies.    Dispense:  30 tablet    Refill:  5  . Azelastine-Fluticasone (DYMISTA) 137-50 MCG/ACT SUSP    Sig: Place 1 spray into both nostrils 2 (two) times daily as needed.    Dispense:  23 g    Refill:  5  . Olopatadine HCl (PATADAY) 0.2 % SOLN    Sig: Place 1 drop into both eyes daily as needed.    Dispense:  2.5 mL    Refill:  5  . EPINEPHrine (EPIPEN 2-PAK) 0.3 mg/0.3 mL IJ SOAJ injection    Sig: Inject 0.3 mLs (0.3 mg total) into the muscle as needed for anaphylaxis.    Dispense:  2 each    Refill:  1    Diagnostics: Spirometry:  Normal with an FEV1 of 109% predicted. This study was performed while the patient was asymptomatic.  Please see scanned spirometry results for details. Environmental skin testing: Positive to grass pollen, weed pollen, ragweed pollen, tree pollen, molds, cat  hair, dog epithelia, cockroach antigen, and dust mite antigen. Food allergen skin testing: Negative despite a positive histamine control.    Physical examination: Blood pressure 110/78, pulse 97, temperature 97.9 F (36.6 C), temperature source Tympanic, resp. rate 16, height 5' 4.5" (1.638 m), weight 188 lb (85.3 kg), SpO2 100 %.  General: Alert, interactive, in no acute distress. HEENT: TMs pearly gray, turbinates edematous with clear discharge, post-pharynx moderately erythematous. Neck: Supple without lymphadenopathy. Lungs: Clear to auscultation without wheezing, rhonchi or rales. CV: Normal S1, S2 without murmurs. Abdomen: Nondistended, nontender. Skin: Warm and dry, without lesions or rashes. Extremities:  No clubbing, cyanosis or edema. Neuro:   Grossly intact.  Review of systems:  Review of systems negative except as noted in HPI / PMHx or noted below: Review of Systems  Constitutional: Negative.   HENT: Negative.   Eyes: Negative.   Respiratory: Negative.   Cardiovascular: Negative.   Gastrointestinal: Negative.   Genitourinary: Negative.   Musculoskeletal: Negative.   Skin: Negative.   Neurological: Negative.   Endo/Heme/Allergies: Negative.   Psychiatric/Behavioral: Negative.     Past medical history:  Past Medical History:  Diagnosis Date  . Anemia   . Hypertension   . Mild intermittent asthma 05/10/2019    Past surgical history:  Past Surgical History:  Procedure Laterality Date  . CESAREAN SECTION     2007, 2014, 2016, 2019    Family history: Family History  Problem Relation Age of Onset  .  Allergic rhinitis Father   . Asthma Neg Hx   . Eczema Neg Hx   . Urticaria Neg Hx   . Immunodeficiency Neg Hx   . Angioedema Neg Hx     Social history: Social History   Socioeconomic History  . Marital status: Single    Spouse name: Not on file  . Number of children: Not on file  . Years of education: Not on file  . Highest education level: Not on  file  Occupational History  . Not on file  Tobacco Use  . Smoking status: Never Smoker  . Smokeless tobacco: Never Used  Substance and Sexual Activity  . Alcohol use: No  . Drug use: No  . Sexual activity: Yes    Birth control/protection: None  Other Topics Concern  . Not on file  Social History Narrative  . Not on file   Social Determinants of Health   Financial Resource Strain:   . Difficulty of Paying Living Expenses: Not on file  Food Insecurity:   . Worried About Charity fundraiser in the Last Year: Not on file  . Ran Out of Food in the Last Year: Not on file  Transportation Needs:   . Lack of Transportation (Medical): Not on file  . Lack of Transportation (Non-Medical): Not on file  Physical Activity:   . Days of Exercise per Week: Not on file  . Minutes of Exercise per Session: Not on file  Stress:   . Feeling of Stress : Not on file  Social Connections:   . Frequency of Communication with Friends and Family: Not on file  . Frequency of Social Gatherings with Friends and Family: Not on file  . Attends Religious Services: Not on file  . Active Member of Clubs or Organizations: Not on file  . Attends Archivist Meetings: Not on file  . Marital Status: Not on file  Intimate Partner Violence:   . Fear of Current or Ex-Partner: Not on file  . Emotionally Abused: Not on file  . Physically Abused: Not on file  . Sexually Abused: Not on file    Environmental History: The patient lives in an apartment with carpeting throughout, gassy, and central air.  There is no known mold/water damage in the home.  There are no pets in the home.  She is a non-smoker.  Current Outpatient Medications  Medication Sig Dispense Refill  . albuterol (PROVENTIL HFA;VENTOLIN HFA) 108 (90 Base) MCG/ACT inhaler Inhale 1-2 puffs into the lungs every 6 (six) hours as needed for wheezing or shortness of breath. 1 Inhaler 0  . cetirizine (ZYRTEC) 10 MG tablet Take 10 mg by mouth daily.     Marland Kitchen ibuprofen (ADVIL,MOTRIN) 800 MG tablet Take 1 tablet (800 mg total) by mouth 3 (three) times daily. 21 tablet 0  . Azelastine-Fluticasone (DYMISTA) 137-50 MCG/ACT SUSP Place 1 spray into both nostrils 2 (two) times daily as needed. 23 g 5  . benzonatate (TESSALON) 100 MG capsule Take 1 capsule (100 mg total) by mouth every 8 (eight) hours. (Patient not taking: Reported on 05/10/2019) 21 capsule 0  . EPINEPHrine (EPIPEN 2-PAK) 0.3 mg/0.3 mL IJ SOAJ injection Inject 0.3 mLs (0.3 mg total) into the muscle as needed for anaphylaxis. 2 each 1  . levocetirizine (XYZAL) 5 MG tablet Take 1 tablet (5 mg total) by mouth daily as needed for allergies. 30 tablet 5  . montelukast (SINGULAIR) 10 MG tablet Take 10 mg by mouth at bedtime.    Marland Kitchen  Olopatadine HCl (PATADAY) 0.2 % SOLN Place 1 drop into both eyes daily as needed. 2.5 mL 5   No current facility-administered medications for this visit.    Known medication allergies: Allergies  Allergen Reactions  . Oxycodone-Acetaminophen Itching    ''didn't like the way it made her feel"    I appreciate the opportunity to take part in Anna Brock's care. Please do not hesitate to contact me with questions.  Sincerely,   R. Jorene Guest, MD

## 2019-05-10 NOTE — Assessment & Plan Note (Signed)
   Treatment plan as outlined above for allergic rhinitis.  A prescription has been provided for Pataday, one drop per eye daily as needed.  I have also recommended eye lubricant drops (i.e., Natural Tears) as needed. 

## 2019-05-10 NOTE — Assessment & Plan Note (Addendum)
   Aeroallergen avoidance measures have been discussed and provided in written form.  A prescription has been provided for levocetirizine(Xyzal), 5 mg daily as needed.  To avoid diminishing benefit with daily use (tachyphylaxis) of second generation antihistamine, consider alternating every few months between fexofenadine (Allegra) and levocetirizine (Xyzal).  Start montelukast 10 mg daily at bedtime.  Potential side effects have been discussed.  A prescription has been provided for azelastine/fluticasone nasal spray, 1 spray per nostril twice daily as needed. Proper nasal spray technique has been discussed and demonstrated.  Nasal saline lavage (NeilMed) has been recommended as needed and prior to medicated nasal sprays along with instructions for proper administration.  The patient is an excellent candidate for aeroallergen immunotherapy and is considering this possibility.

## 2019-05-10 NOTE — Patient Instructions (Addendum)
Perennial and seasonal allergic rhinitis  Aeroallergen avoidance measures have been discussed and provided in written form.  A prescription has been provided for levocetirizine(Xyzal), 5 mg daily as needed.  To avoid diminishing benefit with daily use (tachyphylaxis) of second generation antihistamine, consider alternating every few months between fexofenadine (Allegra) and levocetirizine (Xyzal).  Start montelukast 10 mg daily at bedtime.  Potential side effects have been discussed.  A prescription has been provided for azelastine/fluticasone nasal spray, 1 spray per nostril twice daily as needed. Proper nasal spray technique has been discussed and demonstrated.  Nasal saline lavage (NeilMed) has been recommended as needed and prior to medicated nasal sprays along with instructions for proper administration.  The patient is an excellent candidate for aeroallergen immunotherapy and is considering this possibility.  Allergic conjunctivitis  Treatment plan as outlined above for allergic rhinitis.  A prescription has been provided for Pataday, one drop per eye daily as needed.  I have also recommended eye lubricant drops (i.e., Natural Tears) as needed.  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 Peeling or cooking the food has shown to reduce symptoms and antihistamines may also relieve symptoms. Immunotherapy to the cross reacting pollens has improved or cured OAS in many patients, though this has not been consistent for all patients. Typically OAS is limited to itching or swelling of mucosal tissues from the lips to the back of the throat.   Information about OAS has been discussed and provided in written form.  All foods causing symptoms are to be avoided.  Should symptoms progress beyond the mouth and throat, epinephrine is to be administered and 911 is to be called immediately.  A prescription has been  provided for epinephrine 0.3 mg autoinjector (EpiPen) 2 pack along with instructions for its proper administration.  Chronic sinusitis  Treatment plan as outlined above for allergic rhinitis.  For thick post nasal drainage, nasal congestion, and/or sinus pressure, add guaifenesin 1200 mg (Mucinex Maximum Strength) plus/minus pseudoephedrine 120 mg  twice daily as needed with adequate hydration as discussed. Pseudoephedrine is only to be used for short-term relief of nasal/sinus congestion. Long-term use is discouraged due to potential side effects.  Mild intermittent asthma  Continue to have access to albuterol HFA, 1 to 2 inhalations every 4-6 hours if needed.  Subjective and objective measures of pulmonary function will be followed and the treatment plan will be adjusted accordingly.   Return in about 3 months (around 08/07/2019), or if symptoms worsen or fail to improve.  Control of House Dust Mite Allergen  House dust mites play a major role in allergic asthma and rhinitis.  They occur in environments with high humidity wherever human skin, the food for dust mites is found. High levels have been detected in dust obtained from mattresses, pillows, carpets, upholstered furniture, bed covers, clothes and soft toys.  The principal allergen of the house dust mite is found in its feces.  A gram of dust may contain 1,000 mites and 250,000 fecal particles.  Mite antigen is easily measured in the air during house cleaning activities.    1. Encase mattresses, including the box spring, and pillow, in an air tight cover.  Seal the zipper end of the encased mattresses with wide adhesive tape. 2. Wash the bedding in water of 130 degrees Farenheit weekly.  Avoid cotton comforters/quilts and flannel bedding: the most ideal bed covering is the dacron comforter. 3. Remove all upholstered furniture from the  bedroom. 4. Remove carpets, carpet padding, rugs, and non-washable window drapes from the bedroom.   Wash drapes weekly or use plastic window coverings. 5. Remove all non-washable stuffed toys from the bedroom.  Wash stuffed toys weekly. 6. Have the room cleaned frequently with a vacuum cleaner and a damp dust-mop.  The patient should not be in a room which is being cleaned and should wait 1 hour after cleaning before going into the room. 7. Close and seal all heating outlets in the bedroom.  Otherwise, the room will become filled with dust-laden air.  An electric heater can be used to heat the room. 8. Reduce indoor humidity to less than 50%.  Do not use a humidifier.  Reducing Pollen Exposure  The American Academy of Allergy, Asthma and Immunology suggests the following steps to reduce your exposure to pollen during allergy seasons.    1. Do not hang sheets or clothing out to dry; pollen may collect on these items. 2. Do not mow lawns or spend time around freshly cut grass; mowing stirs up pollen. 3. Keep windows closed at night.  Keep car windows closed while driving. 4. Minimize morning activities outdoors, a time when pollen counts are usually at their highest. 5. Stay indoors as much as possible when pollen counts or humidity is high and on windy days when pollen tends to remain in the air longer. 6. Use air conditioning when possible.  Many air conditioners have filters that trap the pollen spores. 7. Use a HEPA room air filter to remove pollen form the indoor air you breathe.   Control of Mold Allergen  Mold and fungi can grow on a variety of surfaces provided certain temperature and moisture conditions exist.  Outdoor molds grow on plants, decaying vegetation and soil.  The major outdoor mold, Alternaria and Cladosporium, are found in very high numbers during hot and dry conditions.  Generally, a late Summer - Fall peak is seen for common outdoor fungal spores.  Rain will temporarily lower outdoor mold spore count, but counts rise rapidly when the rainy period ends.  The most important  indoor molds are Aspergillus and Penicillium.  Dark, humid and poorly ventilated basements are ideal sites for mold growth.  The next most common sites of mold growth are the bathroom and the kitchen.  Outdoor [employer] 1. Use air conditioning and keep windows closed 2. Avoid exposure to decaying vegetation. 3. Avoid leaf raking. 4. Avoid grain handling. 5. Consider wearing a face mask if working in moldy areas.  Indoor Mold Control 1. Maintain humidity below 50%. 2. Clean washable surfaces with 5% bleach solution. 3. Remove sources e.g. Contaminated carpets.  Control of Dog or Cat Allergen  Avoidance is the best way to manage a dog or cat allergy. If you have a dog or cat and are allergic to dog or cats, consider removing the dog or cat from the home. If you have a dog or cat but don't want to find it a new home, or if your family wants a pet even though someone in the household is allergic, here are some strategies that may help keep symptoms at bay:  1. Keep the pet out of your bedroom and restrict it to only a few rooms. Be advised that keeping the dog or cat in only one room will not limit the allergens to that room. 2. Don't pet, hug or kiss the dog or cat; if you do, wash your hands with soap and water. 3. High-efficiency  particulate air (HEPA) cleaners run continuously in a bedroom or living room can reduce allergen levels over time. 4. Place electrostatic material sheet in the air inlet vent in the bedroom. 5. Regular use of a high-efficiency vacuum cleaner or a central vacuum can reduce allergen levels. 6. Giving your dog or cat a bath at least once a week can reduce airborne allergen.  Control of Cockroach Allergen  Cockroach allergen has been identified as an important cause of acute attacks of asthma, especially in urban settings.  There are fifty-five species of cockroach that exist in the [place], however only three, the [place], [place] species  produce allergen that can affect patients with Asthma.  Allergens can be obtained from fecal particles, egg casings and secretions from cockroaches.    1. Remove food sources. 2. Reduce access to water. 3. Seal access and entry points. 4. Spray runways with 0.5-1% Diazinon or Chlorpyrifos 5. Blow boric acid power under stoves and refrigerator. 6. Place bait stations (hydramethylnon) at feeding sites.    Oral Allergy Syndrome (OAS)  Oral Allergy Syndrome or OAS is an allergic reaction to certain (usually fresh) fruits, nuts, and vegetables. The allergy is not actually an allergy to food but a syndrome that develops in pollen allergy sufferers. The immune system mistakes the food proteins for the pollen proteins and causes an allergic reaction. For instance, an allergy to ragweed is associated with OAS reactions to banana, watermelon, cantaloupe, honeydew, zucchini, and cucumber. This does not mean that all sufferers of an allergy to ragweed will experience adverse effects from all or even any of these foods. Reactions may begin with one type of food and with reactions to others developing later. However, reaction to one or more foods in any given category does not necessarily mean a person is allergic to all foods in that group. OAS sufferers may have a number of reactions that usually occur very rapidly, within minutes of eating a trigger food. The most common reaction is an itching or burning sensation in the lips, mouth, and/or pharynx. Sometimes other reactions can be triggered in the eyes, nose, and skin. The most severe reactions can result in asthma problems or anaphylaxis.  If a sufferer is able to swallow the food, there is a good chance that there will be a reaction later in the gastrointestinal tract. Vomiting, diarrhea, severe indigestion, or cramps may occur.  Treatment: An OAS sufferer should avoid foods to which they are allergic. Peeling or cooking the food has shown to reduce symptoms  in the throat and mouth, but may not relieve symptoms in the gastrointestinal tract. Antihistamines may also relieve the symptoms of the allergy. Persons with severe reactions may consider carrying injectable epinephrine should systemic symptoms occur. Allergy immunotherapy to the pollens has improved or cured OAS in many patients, though this has not been consistent for all patients. Laban Emperor pollen: almonds, apples, celery, cherries, hazel nuts, peaches, pears, parsley, strawberry, raspberry . Birch pollen: almonds, apples, apricots, avocados, bananas, carrots, celery, cherries, chicory, coriander, fennel, fig, hazel nuts, kiwifruit, nectarines, parsley, parsnips, peaches, pears, peppers, plums, potatoes, prunes, soy, strawberries, wheat; Potential: walnuts . Grass pollen: fig, melons, tomatoes, oranges . Mugwort pollen : carrots, celery, coriander, fennel, parsley, peppers, sunflower . Ragweed pollen : banana, cantaloupe, cucumber, green pepper, paprika, sunflower seeds/oil, honeydew, watermelon, zucchini, echinacea, artichoke, dandelions, honey (if bees pollinate from wild flowers), hibiscus or chamomile tea . Possible cross-reactions (to any of the above): berries (strawberries, blueberries, raspberries, etc), citrus (  oranges, lemons, etc), grapes, mango, figs, peanut, pineapple, pomegranates, watermelon

## 2019-05-10 NOTE — Assessment & Plan Note (Addendum)
   Treatment plan as outlined above for allergic rhinitis.  For thick post nasal drainage, nasal congestion, and/or sinus pressure, add guaifenesin 1200 mg (Mucinex Maximum Strength) plus/minus pseudoephedrine 120 mg  twice daily as needed with adequate hydration as discussed. Pseudoephedrine is only to be used for short-term relief of nasal/sinus congestion. Long-term use is discouraged due to potential side effects. 

## 2019-05-10 NOTE — Assessment & Plan Note (Addendum)
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 Peeling or cooking the food has shown to reduce symptoms and antihistamines may also relieve symptoms. Immunotherapy to the cross reacting pollens has improved or cured OAS in many patients, though this has not been consistent for all patients. Typically OAS is limited to itching or swelling of mucosal tissues from the lips to the back of the throat.   Information about OAS has been discussed and provided in written form.  All foods causing symptoms are to be avoided.  Should symptoms progress beyond the mouth and throat, epinephrine is to be administered and 911 is to be called immediately.  A prescription has been provided for epinephrine 0.3 mg autoinjector (EpiPen) 2 pack along with instructions for its proper administration.

## 2019-05-10 NOTE — Assessment & Plan Note (Signed)
   Continue to have access to albuterol HFA, 1 to 2 inhalations every 4-6 hours if needed.  Subjective and objective measures of pulmonary function will be followed and the treatment plan will be adjusted accordingly. 

## 2019-05-10 NOTE — Progress Notes (Addendum)
VIALS EXP 05-10-20.  ADDITIONAL LABELS NEEDED FOR 1/MILLION.

## 2019-05-11 DIAGNOSIS — J301 Allergic rhinitis due to pollen: Secondary | ICD-10-CM

## 2019-05-15 ENCOUNTER — Other Ambulatory Visit: Payer: Self-pay

## 2019-05-15 DIAGNOSIS — J3089 Other allergic rhinitis: Secondary | ICD-10-CM | POA: Diagnosis not present

## 2019-05-15 MED ORDER — AZELASTINE HCL 0.1 % NA SOLN: 2.0000 | Freq: Two times a day (BID) | NASAL | 5 refills | Status: DC

## 2019-05-15 MED ORDER — FLUTICASONE PROPIONATE 50 MCG/ACT NA SUSP: 2.0000 | Freq: Every day | NASAL | 5 refills | Status: DC

## 2019-05-16 ENCOUNTER — Other Ambulatory Visit: Payer: Self-pay

## 2019-05-16 MED ORDER — FLUTICASONE PROPIONATE 50 MCG/ACT NA SUSP: 2.0000 | Freq: Every day | NASAL | 5 refills | Status: DC

## 2019-05-16 MED ORDER — AZELASTINE HCL 0.1 % NA SOLN: 2.0000 | Freq: Two times a day (BID) | NASAL | 5 refills | Status: DC

## 2019-05-16 NOTE — Telephone Encounter (Signed)
Sending a new prescription in for azelastine and fluticasone separate. Combo not covered

## 2019-05-31 ENCOUNTER — Ambulatory Visit: Payer: Medicaid Other

## 2019-08-09 ENCOUNTER — Ambulatory Visit: Payer: Medicaid Other | Admitting: Allergy and Immunology

## 2019-09-23 IMAGING — CT CT ANGIO CHEST
2 of 8 series · 19 of 36 positions shown · IV contrast (iopamidol)
Comparison: Chest radiograph August 22, 2017

CLINICAL DATA: Chest pain

EXAM:
CT ANGIOGRAPHY CHEST WITH CONTRAST
TECHNIQUE: Multidetector CT imaging of the chest was performed using the
standard protocol during bolus administration of intravenous
contrast. Multiplanar CT image reconstructions and MIPs were
obtained to evaluate the vascular anatomy.
CONTRAST:  61mL WOKYM8-AAR IOPAMIDOL (WOKYM8-AAR) INJECTION 76%

[Series 8: pe thins · axial · 0.62mm/px · z∈[-123,+146]mm · 18 of 301 slices shown]
[im 16/301  lung]
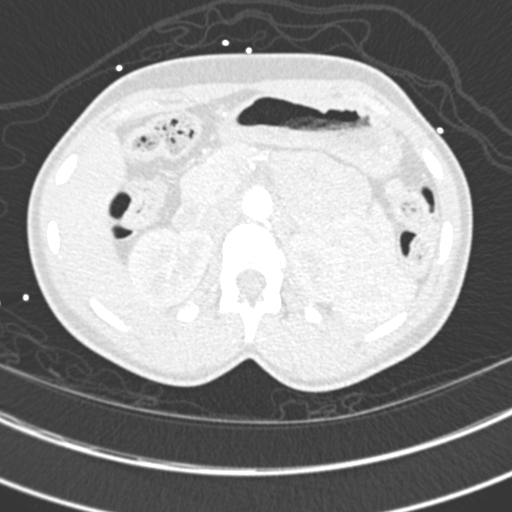
[im 32/301  mediastinal]
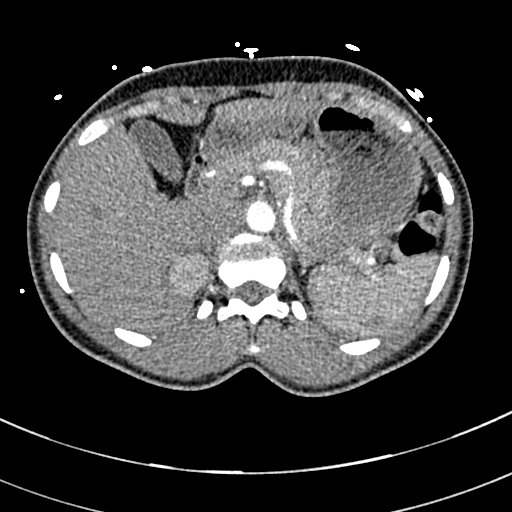
[im 48/301  lung]
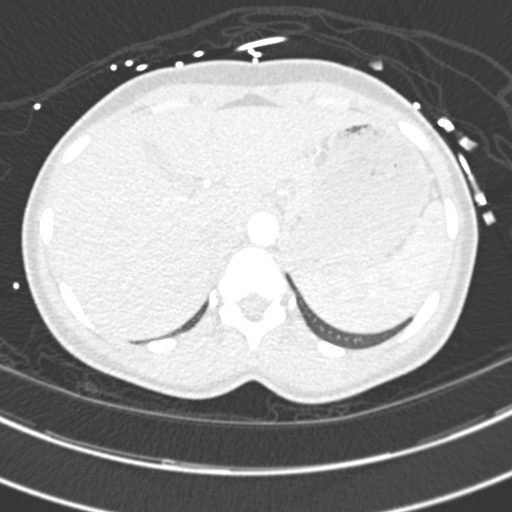
[im 64/301  mediastinal]
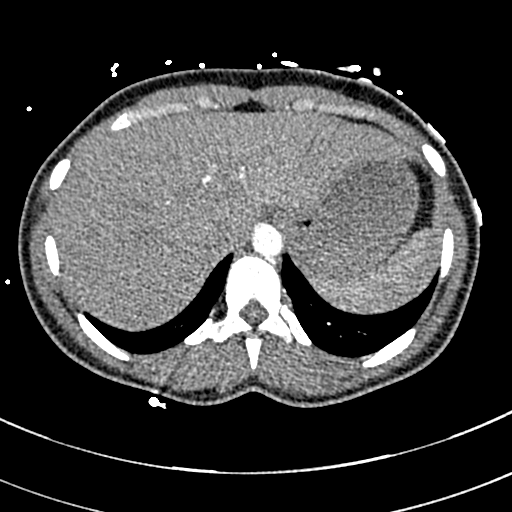
[im 79/301  lung]
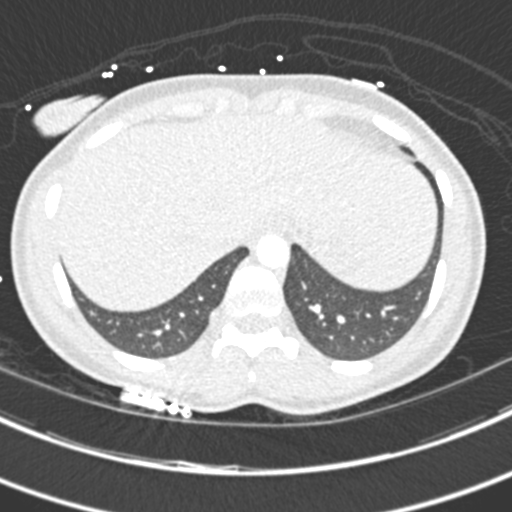
[im 95/301  mediastinal]
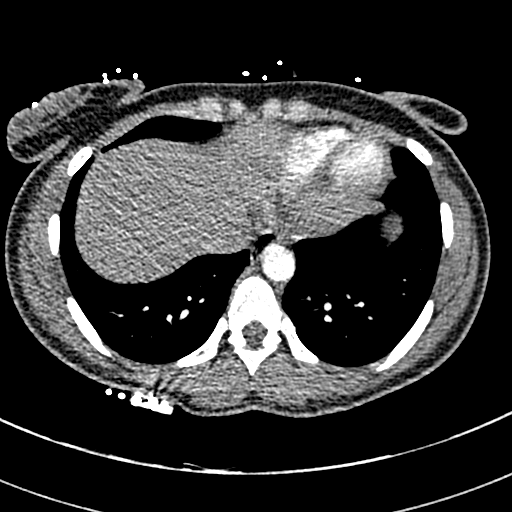
[im 111/301  lung]
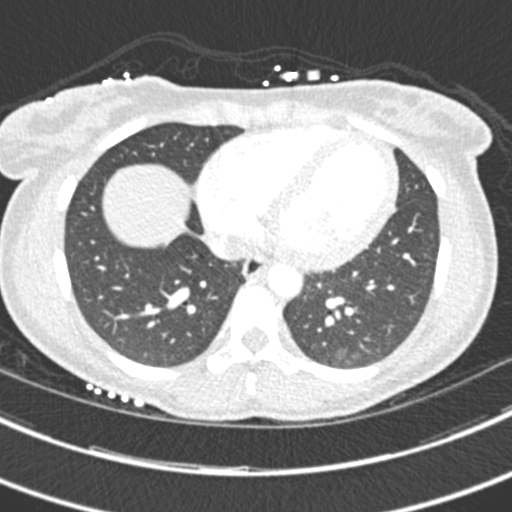
[im 127/301  mediastinal]
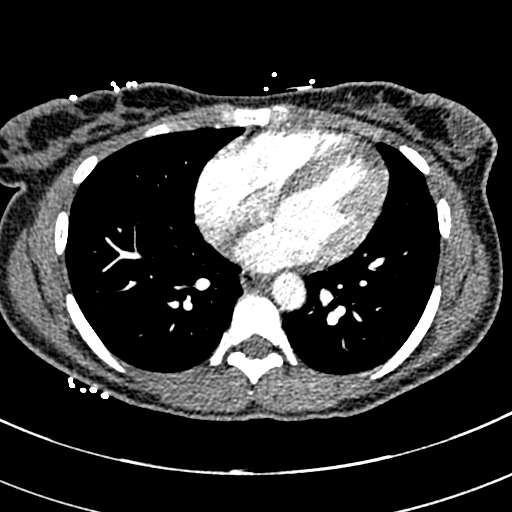
[im 143/301  lung]
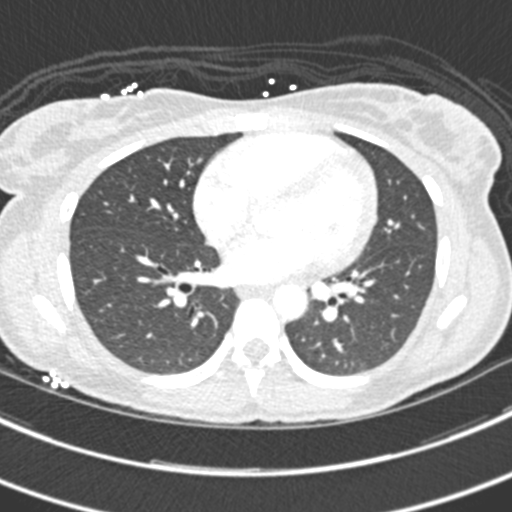
[im 158/301  mediastinal]
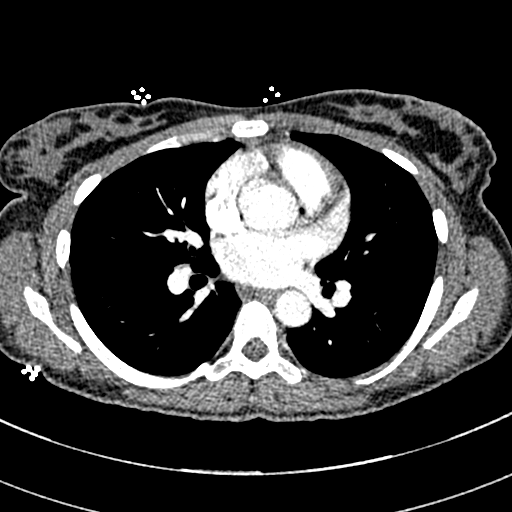
[im 174/301  lung]
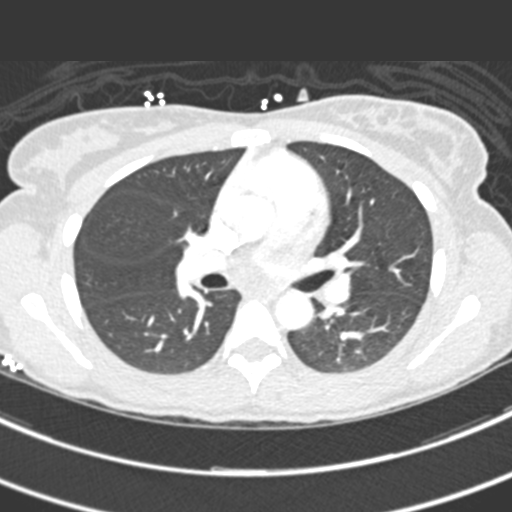
[im 190/301  mediastinal]
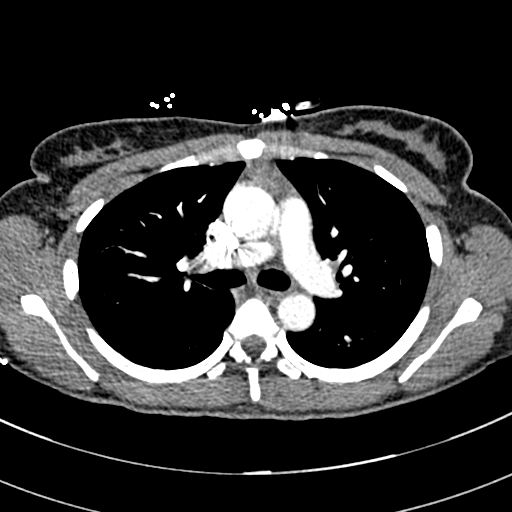
[im 206/301  lung]
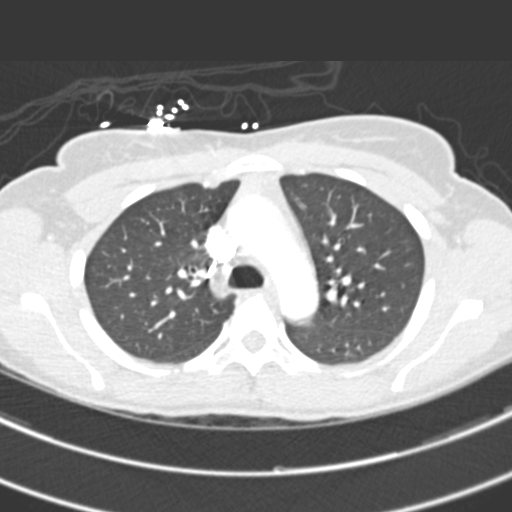
[im 222/301  mediastinal]
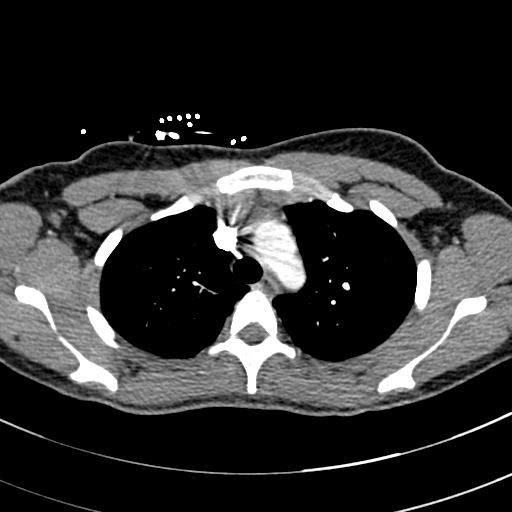
[im 237/301  lung]
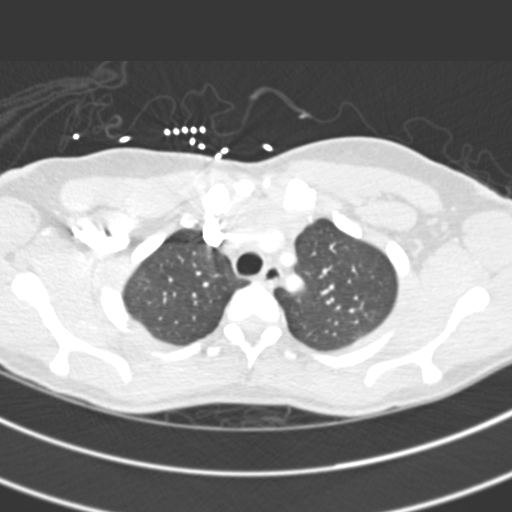
[im 253/301  mediastinal]
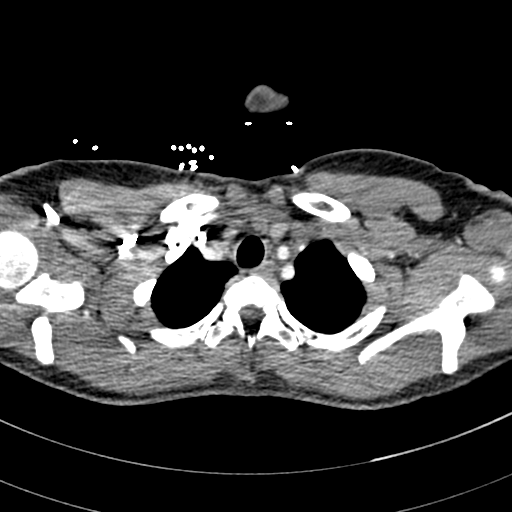
[im 269/301  lung]
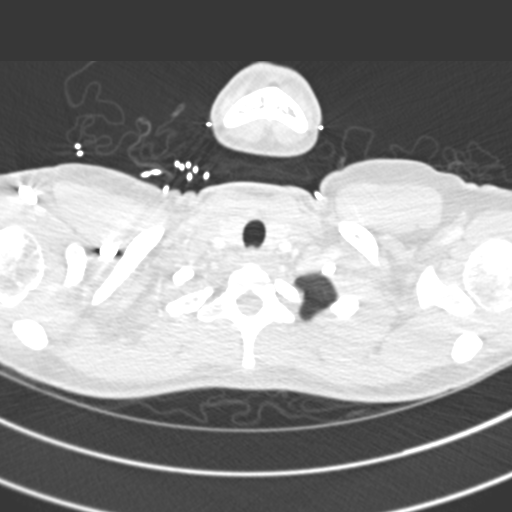
[im 285/301  mediastinal]
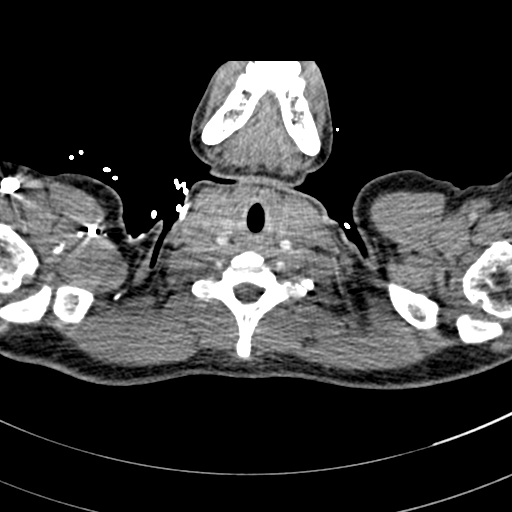

[Series 9: pe coronal mpr · coronal · 0.59mm/px · 1 of 151 slices shown]
[im 76/151  mediastinal]
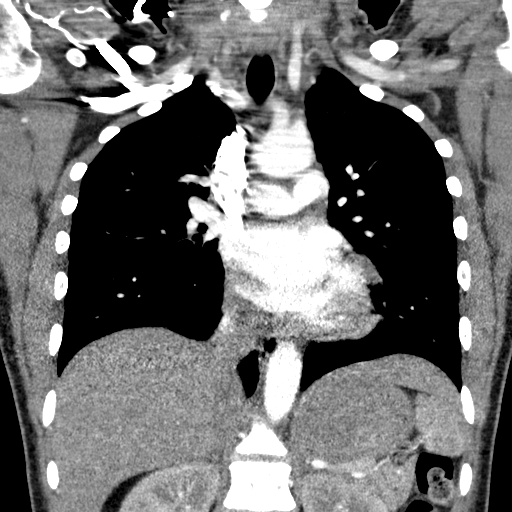

[19 of 36 positions shown; findings below may reference images not displayed]

FINDINGS: Cardiovascular: There is no demonstrable pulmonary embolus. There is
no thoracic aortic aneurysm or dissection. Visualized great vessels
appear normal. There is no pericardial effusion or pericardial
thickening.

Mediastinum/Nodes: Thyroid appears normal. There is mild residual
thymic tissue, normal for age. There is no appreciable thoracic
adenopathy. No esophageal lesions are evident.

Lungs/Pleura: There is no appreciable edema or consolidation. There
is slight atelectatic change in the left lower lobe. No pleural
effusion or pleural thickening evident.

Upper Abdomen: Visualized upper abdominal structures appear
unremarkable.

Musculoskeletal: There are no blastic or lytic bone lesions. No
chest wall lesions are evident.

Review of the MIP images confirms the above findings.
IMPRESSION: 1. No demonstrable pulmonary embolus. No thoracic aortic aneurysm or
dissection.

2.  Slight left base atelectasis.  No lung edema or consolidation.

3.  No evident thoracic adenopathy.

## 2021-12-25 ENCOUNTER — Other Ambulatory Visit: Payer: Self-pay | Admitting: Physician Assistant

## 2021-12-25 DIAGNOSIS — N946 Dysmenorrhea, unspecified: Secondary | ICD-10-CM

## 2021-12-30 ENCOUNTER — Ambulatory Visit
Admission: RE | Admit: 2021-12-30 | Discharge: 2021-12-30 | Disposition: A | Discharge status: Home / Self Care | Payer: Medicaid Other | Source: Ambulatory Visit | Attending: Physician Assistant | Admitting: Physician Assistant

## 2021-12-30 DIAGNOSIS — N946 Dysmenorrhea, unspecified: Secondary | ICD-10-CM

## 2022-08-18 ENCOUNTER — Other Ambulatory Visit: Payer: Self-pay

## 2022-08-18 ENCOUNTER — Encounter: Payer: Self-pay | Admitting: Family

## 2022-08-18 ENCOUNTER — Inpatient Hospital Stay: Payer: Medicaid Other

## 2022-08-18 ENCOUNTER — Inpatient Hospital Stay: Payer: Medicaid Other | Attending: Family | Admitting: Family

## 2022-08-18 VITALS — BP 114/78 | HR 81 | Temp 99.1°F | Resp 16 | Ht 62.5 in | Wt 181.0 lb

## 2022-08-18 DIAGNOSIS — Z79899 Other long term (current) drug therapy: Secondary | ICD-10-CM | POA: Diagnosis not present

## 2022-08-18 DIAGNOSIS — D509 Iron deficiency anemia, unspecified: Secondary | ICD-10-CM | POA: Insufficient documentation

## 2022-08-18 DIAGNOSIS — D500 Iron deficiency anemia secondary to blood loss (chronic): Secondary | ICD-10-CM | POA: Diagnosis not present

## 2022-08-18 NOTE — Progress Notes (Signed)
Hematology/Oncology Consultation   Name: Anna Brock      MRN: [id number]    Location: Room/bed info not found  Date: 08/18/2022 Time:8:52 AM   REFERRING PHYSICIAN: Norva Riffle, PA  REASON FOR CONSULT: Worsening iron deficiency anemia   DIAGNOSIS: Iron deficiency anemia secondary to heavy cycles  HISTORY OF PRESENT ILLNESS: Anna Brock is a pleasant 37 yo African American female with history of iron deficiency anemia unresponsive to oral iron.  Her iron saturation a couple weeks ago was only 3% and ferritin 2.  She has regular heavy cycles with large clots.No other blood loss noted. No bruising or petechiae.  She had TVUS back in October 2023 which showed a hemorrhagic cyst on her right ovary. She has follow-up with gynecology to discuss treatment options.  She is symptomatic with fatigue, chewing ice, SOB with exertion, dizziness, palpitations and numbness and tingling in her hands and feet.  Her maternal aunt also had history of IDA.  Patient states that she required a blood transfusion after her last daughter was born 5 years ago.  She has 4 children and no history of miscarriage. She had 4 C-sections and denies any complications.  No personal history of cancer. Her father has had both lung and prostate and paternal grandmother had an unknown primary.  No history of diabetes or thyroid disease.  No fever, chills, n/v, cough, rash, chest pain, abdominal pain or changes in bladder habits at this time.  Oral iron has caused constipation.  No swelling or tenderness in her extremities.  No falls reported. She has had syncope in the past with IDA.  No smoking or recreational drug use. Rare ETOH socially.  Appetite and hydration are good. Weight is stable at 181 lbs.  She stays quite busy with her sweet family.  She also works at [employer] and her job is quite physical.   ROS: All other 10 point review of systems is negative.   PAST MEDICAL HISTORY:   Past Medical History:  Diagnosis  Date   Anemia    Hypertension    Mild intermittent asthma 05/10/2019    ALLERGIES: Allergies  Allergen Reactions   Oxycodone-Acetaminophen Itching    ''didn't like the way it made her feel"      MEDICATIONS:  Current Outpatient Medications on File Prior to Visit  Medication Sig Dispense Refill   albuterol (PROVENTIL HFA;VENTOLIN HFA) 108 (90 Base) MCG/ACT inhaler Inhale 1-2 puffs into the lungs every 6 (six) hours as needed for wheezing or shortness of breath. 1 Inhaler 0   azelastine (ASTELIN) 0.1 % nasal spray Place 2 sprays into both nostrils 2 (two) times daily. Use in each nostril as directed 30 mL 5   Azelastine-Fluticasone (DYMISTA) 137-50 MCG/ACT SUSP Place 1 spray into both nostrils 2 (two) times daily as needed. 23 g 5   benzonatate (TESSALON) 100 MG capsule Take 1 capsule (100 mg total) by mouth every 8 (eight) hours. (Patient not taking: Reported on 05/10/2019) 21 capsule 0   cetirizine (ZYRTEC) 10 MG tablet Take 10 mg by mouth daily.     EPINEPHrine (EPIPEN 2-PAK) 0.3 mg/0.3 mL IJ SOAJ injection Inject 0.3 mLs (0.3 mg total) into the muscle as needed for anaphylaxis. 2 each 1   fluticasone (FLONASE) 50 MCG/ACT nasal spray Place 2 sprays into both nostrils daily. 18.2 mL 5   ibuprofen (ADVIL,MOTRIN) 800 MG tablet Take 1 tablet (800 mg total) by mouth 3 (three) times daily. 21 tablet 0   levocetirizine (XYZAL) 5 MG tablet  Take 1 tablet (5 mg total) by mouth daily as needed for allergies. 30 tablet 5   montelukast (SINGULAIR) 10 MG tablet Take 10 mg by mouth at bedtime.     Olopatadine HCl (PATADAY) 0.2 % SOLN Place 1 drop into both eyes daily as needed. 2.5 mL 5   No current facility-administered medications on file prior to visit.     PAST SURGICAL HISTORY Past Surgical History:  Procedure Laterality Date   CESAREAN SECTION     2007, 2014, 2016, 2019    FAMILY HISTORY: Family History  Problem Relation Age of Onset   Allergic rhinitis Father    Asthma Neg Hx     Eczema Neg Hx    Urticaria Neg Hx    Immunodeficiency Neg Hx    Angioedema Neg Hx     SOCIAL HISTORY:  reports that she has never smoked. She has never used smokeless tobacco. She reports that she does not drink alcohol and does not use drugs.  PERFORMANCE STATUS: The patient's performance status is 1 - Symptomatic but completely ambulatory  PHYSICAL EXAM: Most Recent Vital Signs: There were no vitals taken for this visit. There were no vitals taken for this visit.  General Appearance:    Alert, cooperative, no distress, appears stated age  Head:    Normocephalic, without obvious abnormality, atraumatic  Eyes:    PERRL, conjunctiva/corneas clear, EOM's intact, fundi    benign, both eyes        Throat:   Lips, mucosa, and tongue normal; teeth and gums normal  Neck:   Supple, symmetrical, trachea midline, no adenopathy;    thyroid:  no enlargement/tenderness/nodules; no carotid   bruit or JVD  Back:     Symmetric, no curvature, ROM normal, no CVA tenderness  Lungs:     Clear to auscultation bilaterally, respirations unlabored  Chest Wall:    No tenderness or deformity   Heart:    Regular rate and rhythm, S1 and S2 normal, no murmur, rub   or gallop     Abdomen:     Soft, non-tender, bowel sounds active all four quadrants,    no masses, no organomegaly        Extremities:   Extremities normal, atraumatic, no cyanosis or edema  Pulses:   2+ and symmetric all extremities  Skin:   Skin color, texture, turgor normal, no rashes or lesions  Lymph nodes:   Cervical, supraclavicular, and axillary nodes normal  Neurologic:   CNII-XII intact, normal strength, sensation and reflexes    throughout    LABORATORY DATA:  No results found for this or any previous visit (from the past 48 hour(s)).    RADIOGRAPHY: No results found.     PATHOLOGY: None   ASSESSMENT/PLAN: Anna Brock is a pleasant 37 yo African American female with history of iron deficiency anemia secondary to heavy  cycles.  We will get her set uo for one dose of Monoferric and then follow-up in 8 weeks.   All questions were answered. The patient knows to call the clinic with any problems, questions or concerns. We can certainly see the patient much sooner if necessary.   Eileen Stanford, NP

## 2022-08-20 ENCOUNTER — Inpatient Hospital Stay: Payer: Medicaid Other

## 2022-08-20 VITALS — BP 116/78 | HR 86 | Temp 98.6°F | Resp 18

## 2022-08-20 DIAGNOSIS — D500 Iron deficiency anemia secondary to blood loss (chronic): Secondary | ICD-10-CM

## 2022-08-20 DIAGNOSIS — D509 Iron deficiency anemia, unspecified: Secondary | ICD-10-CM | POA: Diagnosis not present

## 2022-08-20 MED ORDER — FAMOTIDINE IN NACL 20-0.9 MG/50ML-% IV SOLN
20.0000 mg | Freq: Once | INTRAVENOUS | Status: AC
  Administered 2022-08-20: 20 mg via INTRAVENOUS

## 2022-08-20 MED ORDER — SODIUM CHLORIDE 0.9 % IV SOLN: INTRAVENOUS | Status: DC

## 2022-08-20 MED ORDER — SODIUM CHLORIDE 0.9 % IV SOLN: Freq: Once | INTRAVENOUS | Status: AC

## 2022-08-20 MED ORDER — SODIUM CHLORIDE 0.9 % IV SOLN
1000.0000 mg | Freq: Once | INTRAVENOUS | Status: AC
  Administered 2022-08-20: 1000 mg via INTRAVENOUS
  Filled 2022-08-20: qty 10

## 2022-08-20 NOTE — Patient Instructions (Signed)

## 2022-08-20 NOTE — Progress Notes (Signed)
@  1349 monoferric up to infuse @ 300 mls per hr for 100 mls. @ 1359 patient c/o burning just below her sternum. Monoferric stopped. NS up at [facility]. VSS, Clent Jacks PA in to see patient. Pepcid ordered.hung at 1401. Patient then states she just burped and feels better. No more burning. Will give 500 cc NS and then proceed with remainder of iron. Iron restarted at 1440. momoferric infused at 1500. 1530 VS 116/78  pulse 86

## 2022-10-13 ENCOUNTER — Inpatient Hospital Stay: Payer: Medicaid Other | Admitting: Family

## 2022-10-13 ENCOUNTER — Inpatient Hospital Stay: Payer: Medicaid Other | Attending: Family

## 2022-12-01 ENCOUNTER — Inpatient Hospital Stay: Payer: Medicaid Other | Admitting: Medical Oncology

## 2022-12-01 ENCOUNTER — Inpatient Hospital Stay: Payer: Medicaid Other

## 2023-05-14 ENCOUNTER — Encounter: Payer: Self-pay | Admitting: Family

## 2023-05-14 ENCOUNTER — Inpatient Hospital Stay: Payer: Medicaid Other | Attending: Hematology & Oncology

## 2023-05-14 ENCOUNTER — Inpatient Hospital Stay: Payer: Medicaid Other | Admitting: Family

## 2023-05-14 VITALS — BP 122/86 | HR 73 | Temp 99.4°F | Resp 18 | Ht 63.0 in | Wt 191.0 lb

## 2023-05-14 DIAGNOSIS — D500 Iron deficiency anemia secondary to blood loss (chronic): Secondary | ICD-10-CM | POA: Insufficient documentation

## 2023-05-14 DIAGNOSIS — N920 Excessive and frequent menstruation with regular cycle: Secondary | ICD-10-CM | POA: Diagnosis present

## 2023-05-14 LAB — CBC WITH DIFFERENTIAL (CANCER CENTER ONLY)
Abs Immature Granulocytes: 0 10*3/uL (ref 0.00–0.07)
Basophils Absolute: 0.1 10*3/uL (ref 0.0–0.1)
Basophils Relative: 1 %
Eosinophils Absolute: 0.3 10*3/uL (ref 0.0–0.5)
Eosinophils Relative: 7 %
HCT: 29.7 % — ABNORMAL LOW (ref 36.0–46.0)
Hemoglobin: 9.1 g/dL — ABNORMAL LOW (ref 12.0–15.0)
Immature Granulocytes: 0 %
Lymphocytes Relative: 53 %
Lymphs Abs: 2.3 10*3/uL (ref 0.7–4.0)
MCH: 24.7 pg — ABNORMAL LOW (ref 26.0–34.0)
MCHC: 30.6 g/dL (ref 30.0–36.0)
MCV: 80.5 fL (ref 80.0–100.0)
Monocytes Absolute: 0.3 10*3/uL (ref 0.1–1.0)
Monocytes Relative: 6 %
Neutro Abs: 1.5 10*3/uL — ABNORMAL LOW (ref 1.7–7.7)
Neutrophils Relative %: 33 %
Platelet Count: 375 10*3/uL (ref 150–400)
RBC: 3.69 MIL/uL — ABNORMAL LOW (ref 3.87–5.11)
RDW: 18.4 % — ABNORMAL HIGH (ref 11.5–15.5)
WBC Count: 4.4 10*3/uL (ref 4.0–10.5)
nRBC: 0 % (ref 0.0–0.2)

## 2023-05-14 LAB — IRON AND IRON BINDING CAPACITY (CC-WL,HP ONLY)
Iron: 14 ug/dL — ABNORMAL LOW (ref 28–170)
Saturation Ratios: 3 % — ABNORMAL LOW (ref 10.4–31.8)
TIBC: 416 ug/dL (ref 250–450)
UIBC: 402 ug/dL (ref 148–442)

## 2023-05-14 LAB — RETICULOCYTES
Immature Retic Fract: 33.3 % — ABNORMAL HIGH (ref 2.3–15.9)
RBC.: 3.68 MIL/uL — ABNORMAL LOW (ref 3.87–5.11)
Retic Count, Absolute: 45.6 10*3/uL (ref 19.0–186.0)
Retic Ct Pct: 1.2 % (ref 0.4–3.1)

## 2023-05-14 LAB — FERRITIN: Ferritin: 4 ng/mL — ABNORMAL LOW (ref 11–307)

## 2023-05-14 NOTE — Progress Notes (Signed)
 Hematology and Oncology Follow Up Visit  Anna Brock [id number] 1985-04-13 38 y.o. 05/14/2023   Principle Diagnosis:  Iron deficiency anemia secondary to heavy cycles   Current Therapy:   Iv iron as indicated    Interim History:  Anna Brock is here today for follow-up. She is having some sinus congestion and drainage. She notes occasional dizziness with this.  No fever, chills, n/v, cough, rash, SOB, chest pain, palpitations, abdominal pain or changes in bowel or bladder habits.  Numbness and tingling comes and goes in her hands and feet.  She states that her cycle is regular with heavy flow and clots.  No other blood loss noted.  No abnormal bruising or petechiae.  Appetite and hydration are fair. Weight is stable at 191 lbs.   ECOG Performance Status: 1 - Symptomatic but completely ambulatory  Medications:  Allergies as of 05/14/2023       Reactions   Oxycodone-acetaminophen Itching   ''didn't like the way it made her feel"        Medication List        Accurate as of May 14, 2023 10:44 AM. If you have any questions, ask your nurse or doctor.          ACCRUFeR 30 MG Caps Generic drug: Ferric Maltol Take 30 mg by mouth daily.   cetirizine 10 MG tablet Commonly known as: ZYRTEC Take 10 mg by mouth daily.        Allergies:  Allergies  Allergen Reactions   Oxycodone-Acetaminophen Itching    ''didn't like the way it made her feel"    Past Medical History, Surgical history, Social history, and Family History were reviewed and updated.  Review of Systems: All other 10 point review of systems is negative.   Physical Exam:  vitals were not taken for this visit.   Wt Readings from Last 3 Encounters:  08/18/22 181 lb (82.1 kg)  05/10/19 188 lb (85.3 kg)  12/11/17 165 lb (74.8 kg)    Ocular: Sclerae unicteric, pupils equal, round and reactive to light Ear-nose-throat: Oropharynx clear, dentition fair Lymphatic: No cervical or supraclavicular  adenopathy Lungs no rales or rhonchi, good excursion bilaterally Heart regular rate and rhythm, no murmur appreciated Abd soft, nontender, positive bowel sounds MSK no focal spinal tenderness, no joint edema Neuro: non-focal, well-oriented, appropriate affect Breasts: Deferred   Lab Results  Component Value Date   WBC 4.3 08/22/2017   HGB 8.9 (L) 08/22/2017   HCT 28.3 (L) 08/22/2017   MCV 81.1 08/22/2017   PLT 337 08/22/2017   No results found for: "FERRITIN", "IRON", "TIBC", "UIBC", "IRONPCTSAT" Lab Results  Component Value Date   RBC 3.49 (L) 08/22/2017   No results found for: "KPAFRELGTCHN", "LAMBDASER", "KAPLAMBRATIO" No results found for: "IGGSERUM", "IGA", "IGMSERUM" No results found for: "TOTALPROTELP", "ALBUMINELP", "A1GS", "A2GS", "BETS", "BETA2SER", "GAMS", "MSPIKE", "SPEI"   Chemistry      Component Value Date/Time   NA 139 08/22/2017 1424   K 3.8 08/22/2017 1424   CL 108 08/22/2017 1424   CO2 25 08/22/2017 1424   BUN 7 08/22/2017 1424   CREATININE 0.70 08/22/2017 1424      Component Value Date/Time   CALCIUM 9.0 08/22/2017 1424   ALKPHOS 48 06/14/2015 1325   AST 17 06/14/2015 1325   ALT 10 (L) 06/14/2015 1325   BILITOT 0.5 06/14/2015 1325       Impression and Plan: Anna Brock is a pleasant 38 yo African American female with history of iron deficiency anemia  secondary to heavy cycles.  Iron studies are pending. We will replace again when needed.  Follow-up in 2 months.   Eileen Stanford, NP 2/28/202510:44 AM

## 2023-05-18 ENCOUNTER — Telehealth: Payer: Self-pay | Admitting: Family

## 2023-05-18 NOTE — Telephone Encounter (Signed)
 She needs 1 dose of Iv iron please. Thank you!   Per inbasket.

## 2023-05-25 ENCOUNTER — Inpatient Hospital Stay: Attending: Hematology & Oncology

## 2023-05-25 ENCOUNTER — Other Ambulatory Visit: Payer: Self-pay | Admitting: Medical Oncology

## 2023-05-25 ENCOUNTER — Other Ambulatory Visit: Payer: Self-pay | Admitting: Family

## 2023-05-25 VITALS — BP 123/95 | HR 75

## 2023-05-25 DIAGNOSIS — D500 Iron deficiency anemia secondary to blood loss (chronic): Secondary | ICD-10-CM | POA: Insufficient documentation

## 2023-05-25 DIAGNOSIS — N920 Excessive and frequent menstruation with regular cycle: Secondary | ICD-10-CM | POA: Insufficient documentation

## 2023-05-25 DIAGNOSIS — Z79899 Other long term (current) drug therapy: Secondary | ICD-10-CM | POA: Insufficient documentation

## 2023-05-25 MED ORDER — SODIUM CHLORIDE 0.9 % IV SOLN: Freq: Once | INTRAVENOUS | Status: DC | PRN

## 2023-05-25 MED ORDER — FERRIC DERISOMALTOSE(ONE DOSE) 1000 MG/10ML IV SOLN
1000.0000 mg | Freq: Once | INTRAVENOUS | Status: AC
  Administered 2023-05-25: 1000 mg via INTRAVENOUS
  Filled 2023-05-25: qty 10

## 2023-05-25 MED ORDER — FAMOTIDINE IN NACL 20-0.9 MG/50ML-% IV SOLN: 20.0000 mg | Freq: Once | INTRAVENOUS | Status: DC | PRN

## 2023-05-25 MED ORDER — METHYLPREDNISOLONE SODIUM SUCC 125 MG IJ SOLR: 125.0000 mg | Freq: Once | INTRAMUSCULAR | Status: DC | PRN

## 2023-05-25 MED ORDER — ALBUTEROL SULFATE HFA 108 (90 BASE) MCG/ACT IN AERS: 2.0000 | INHALATION_SPRAY | Freq: Once | RESPIRATORY_TRACT | Status: DC | PRN

## 2023-05-25 MED ORDER — METHYLPREDNISOLONE SODIUM SUCC 125 MG IJ SOLR
125.0000 mg | Freq: Once | INTRAMUSCULAR | Status: AC
  Administered 2023-05-25: 125 mg via INTRAVENOUS

## 2023-05-25 MED ORDER — DIPHENHYDRAMINE HCL 50 MG/ML IJ SOLN: 50.0000 mg | Freq: Once | INTRAMUSCULAR | Status: DC | PRN

## 2023-05-25 MED ORDER — EPINEPHRINE 0.3 MG/0.3ML IJ SOAJ: 0.3000 mg | Freq: Once | INTRAMUSCULAR | Status: DC | PRN

## 2023-05-25 MED ORDER — FAMOTIDINE IN NACL 20-0.9 MG/50ML-% IV SOLN
20.0000 mg | Freq: Once | INTRAVENOUS | Status: AC
  Administered 2023-05-25: 20 mg via INTRAVENOUS

## 2023-05-25 NOTE — Progress Notes (Signed)
 Hypersensitivity Reaction note  Date of event: 05/25/23 Time of event: 1104 Generic name of drug involved: Monoferric Name of provider notified of the hypersensitivity reaction: Clent Jacks, PA Was agent that likely caused hypersensitivity reaction added to Allergies List within EMR? yes  1104  Monoferric infusing. Pt c/o abdominal pain quickly states " I cant breath". Monoferric stopped, NS to gravity, , O2 2L via [place], Emergency meds given. PA called to chairside. VS taken, pt continued to be monitored. 1120 pt states still having abdominal pain. PA made aware of pt status. Will continue to monitor.

## 2023-05-25 NOTE — Patient Instructions (Signed)

## 2023-06-10 ENCOUNTER — Telehealth: Payer: Self-pay | Admitting: Hematology & Oncology

## 2023-06-10 NOTE — Telephone Encounter (Signed)
 Called to schedule IV Iron (3hr for premeds). LVM to return call for scheduling.

## 2023-06-22 ENCOUNTER — Inpatient Hospital Stay: Attending: Hematology & Oncology

## 2023-08-11 ENCOUNTER — Inpatient Hospital Stay: Payer: Medicaid Other | Admitting: Family

## 2023-08-11 ENCOUNTER — Inpatient Hospital Stay: Payer: Medicaid Other | Attending: Family

## 2024-03-14 ENCOUNTER — Other Ambulatory Visit: Payer: Self-pay

## 2024-03-14 ENCOUNTER — Inpatient Hospital Stay: Attending: Hematology & Oncology

## 2024-03-14 ENCOUNTER — Inpatient Hospital Stay: Admitting: Family

## 2024-03-14 ENCOUNTER — Encounter: Payer: Self-pay | Admitting: Family

## 2024-03-14 VITALS — BP 113/72 | HR 85 | Temp 98.6°F | Resp 18 | Wt 186.0 lb

## 2024-03-14 DIAGNOSIS — D500 Iron deficiency anemia secondary to blood loss (chronic): Secondary | ICD-10-CM | POA: Diagnosis not present

## 2024-03-14 DIAGNOSIS — D509 Iron deficiency anemia, unspecified: Secondary | ICD-10-CM | POA: Insufficient documentation

## 2024-03-14 LAB — CBC WITH DIFFERENTIAL (CANCER CENTER ONLY)
Abs Immature Granulocytes: 0.04 K/uL (ref 0.00–0.07)
Basophils Absolute: 0 K/uL (ref 0.0–0.1)
Basophils Relative: 1 %
Eosinophils Absolute: 0.2 K/uL (ref 0.0–0.5)
Eosinophils Relative: 4 %
HCT: 26.9 % — ABNORMAL LOW (ref 36.0–46.0)
Hemoglobin: 7.8 g/dL — ABNORMAL LOW (ref 12.0–15.0)
Immature Granulocytes: 1 %
Lymphocytes Relative: 42 %
Lymphs Abs: 2.4 K/uL (ref 0.7–4.0)
MCH: 20.9 pg — ABNORMAL LOW (ref 26.0–34.0)
MCHC: 29 g/dL — ABNORMAL LOW (ref 30.0–36.0)
MCV: 72.1 fL — ABNORMAL LOW (ref 80.0–100.0)
Monocytes Absolute: 0.3 K/uL (ref 0.1–1.0)
Monocytes Relative: 6 %
Neutro Abs: 2.6 K/uL (ref 1.7–7.7)
Neutrophils Relative %: 46 %
Platelet Count: 352 K/uL (ref 150–400)
RBC: 3.73 MIL/uL — ABNORMAL LOW (ref 3.87–5.11)
RDW: 21.5 % — ABNORMAL HIGH (ref 11.5–15.5)
WBC Count: 5.7 K/uL (ref 4.0–10.5)
nRBC: 0 % (ref 0.0–0.2)

## 2024-03-14 LAB — FERRITIN: Ferritin: 12 ng/mL (ref 11–307)

## 2024-03-14 LAB — IRON AND IRON BINDING CAPACITY (CC-WL,HP ONLY)
Iron: 28 ug/dL (ref 28–170)
Saturation Ratios: 7 % — ABNORMAL LOW (ref 10.4–31.8)
TIBC: 417 ug/dL (ref 250–450)
UIBC: 389 ug/dL

## 2024-03-14 LAB — RETICULOCYTES
Immature Retic Fract: 37.7 % — ABNORMAL HIGH (ref 2.3–15.9)
RBC.: 3.82 MIL/uL — ABNORMAL LOW (ref 3.87–5.11)
Retic Count, Absolute: 73.7 K/uL (ref 19.0–186.0)
Retic Ct Pct: 1.9 % (ref 0.4–3.1)

## 2024-03-14 NOTE — Progress Notes (Signed)
 " Hematology and Oncology Follow Up Visit  Anna Brock [id number] 07-09-1985 38 y.o. 03/14/2024   Principle Diagnosis:  Iron deficiency anemia secondary to heavy cycles    Current Therapy:        IV iron as indicated    Interim History:  Anna Brock is here today for follow-up. She is symptomatic with dizziness, fatigue, SOB with exertion and muscle pain and weakness.  She continues to have a regular cycle with heavy flow. No other blood loss noted.  Hgb is 7.8, MCV 72, platelets 352 and WBC count 5.7.  No fever, chills, n/v, cough, rash, chest pain, palpitations, abdominal pain or changes in bowel or bladder habits.  No swelling in her extremities.  No falls or syncope.  Appetite and hydration are good. Weight is stable at 186 lbs.   ECOG Performance Status: 1 - Symptomatic but completely ambulatory  Medications:  Allergies as of 03/14/2024       Reactions   Monoferric  [ferric Derisomaltose ] Anaphylaxis   Oxycodone-acetaminophen  Itching   ''didn't like the way it made her feel        Medication List        Accurate as of March 14, 2024  1:48 PM. If you have any questions, ask your nurse or doctor.          ACCRUFeR 30 MG Caps Generic drug: Ferric Maltol Take 30 mg by mouth daily.   cetirizine 10 MG tablet Commonly known as: ZYRTEC Take 10 mg by mouth daily.        Allergies: Allergies[1]  Past Medical History, Surgical history, Social history, and Family History were reviewed and updated.  Review of Systems: All other 10 point review of systems is negative.   Physical Exam:  vitals were not taken for this visit.   Wt Readings from Last 3 Encounters:  05/14/23 191 lb (86.6 kg)  08/18/22 181 lb (82.1 kg)  05/10/19 188 lb (85.3 kg)    Ocular: Sclerae unicteric, pupils equal, round and reactive to light Ear-nose-throat: Oropharynx clear, dentition fair Lymphatic: No cervical or supraclavicular adenopathy Lungs no rales or rhonchi, good  excursion bilaterally Heart regular rate and rhythm, no murmur appreciated Abd soft, nontender, positive bowel sounds MSK no focal spinal tenderness, no joint edema Neuro: non-focal, well-oriented, appropriate affect Breasts: Deferred   Lab Results  Component Value Date   WBC 4.4 05/14/2023   HGB 9.1 (L) 05/14/2023   HCT 29.7 (L) 05/14/2023   MCV 80.5 05/14/2023   PLT 375 05/14/2023   Lab Results  Component Value Date   FERRITIN 4 (L) 05/14/2023   IRON 14 (L) 05/14/2023   TIBC 416 05/14/2023   UIBC 402 05/14/2023   IRONPCTSAT 3 (L) 05/14/2023   Lab Results  Component Value Date   RETICCTPCT 1.2 05/14/2023   RBC 3.69 (L) 05/14/2023   No results found for: KPAFRELGTCHN, LAMBDASER, KAPLAMBRATIO No results found for: IGGSERUM, IGA, IGMSERUM No results found for: STEPHANY CARLOTA BENSON MARKEL EARLA JOANNIE DOC, MSPIKE, SPEI   Chemistry      Component Value Date/Time   NA 139 08/22/2017 1424   K 3.8 08/22/2017 1424   CL 108 08/22/2017 1424   CO2 25 08/22/2017 1424   BUN 7 08/22/2017 1424   CREATININE 0.70 08/22/2017 1424      Component Value Date/Time   CALCIUM 9.0 08/22/2017 1424   ALKPHOS 48 06/14/2015 1325   AST 17 06/14/2015 1325   ALT 10 (L) 06/14/2015 1325   BILITOT 0.5 06/14/2015  1325       Impression and Plan: Anna Brock is a pleasant 38 yo African American female with history of iron deficiency anemia secondary to heavy cycles.  We will get her set up for 2 doses of Feraheme.  Follow-up in 6 weeks.    Lauraine Pepper, NP 12/30/20251:48 PM     [1]  Allergies Allergen Reactions   Monoferric  [Ferric Derisomaltose ] Anaphylaxis   Oxycodone-Acetaminophen  Itching    ''didn't like the way it made her feel   "

## 2024-03-20 ENCOUNTER — Inpatient Hospital Stay: Attending: Hematology & Oncology

## 2024-03-20 VITALS — BP 123/94 | HR 74 | Temp 98.0°F | Resp 18

## 2024-03-20 DIAGNOSIS — D500 Iron deficiency anemia secondary to blood loss (chronic): Secondary | ICD-10-CM

## 2024-03-20 MED ORDER — SODIUM CHLORIDE 0.9 % IV SOLN
510.0000 mg | Freq: Once | INTRAVENOUS | Status: AC
  Administered 2024-03-20: 510 mg via INTRAVENOUS
  Filled 2024-03-20: qty 510

## 2024-03-20 MED ORDER — METHYLPREDNISOLONE SODIUM SUCC 125 MG IJ SOLR
60.0000 mg | Freq: Once | INTRAMUSCULAR | Status: AC
  Administered 2024-03-20: 60 mg via INTRAVENOUS
  Filled 2024-03-20: qty 2

## 2024-03-20 MED ORDER — LORATADINE 10 MG PO TABS
10.0000 mg | ORAL_TABLET | Freq: Once | ORAL | Status: AC
  Administered 2024-03-20: 10 mg via ORAL
  Filled 2024-03-20: qty 1

## 2024-03-20 MED ORDER — SODIUM CHLORIDE 0.9 % IV SOLN: INTRAVENOUS | Status: DC

## 2024-03-20 MED ORDER — FAMOTIDINE IN NACL 20-0.9 MG/50ML-% IV SOLN
20.0000 mg | Freq: Once | INTRAVENOUS | Status: AC
  Administered 2024-03-20: 20 mg via INTRAVENOUS
  Filled 2024-03-20: qty 50

## 2024-03-20 MED ORDER — CETIRIZINE HCL 10 MG PO TABS: 10.0000 mg | ORAL_TABLET | Freq: Once | ORAL | Status: DC

## 2024-03-20 NOTE — Patient Instructions (Signed)

## 2024-03-27 ENCOUNTER — Inpatient Hospital Stay

## 2024-04-25 ENCOUNTER — Inpatient Hospital Stay: Admitting: Family

## 2024-04-25 ENCOUNTER — Inpatient Hospital Stay

## 2024-05-08 ENCOUNTER — Inpatient Hospital Stay

## 2024-05-08 ENCOUNTER — Inpatient Hospital Stay: Admitting: Family
